# Patient Record
Sex: Female | Born: 1950
Health system: Southern US, Community
[De-identification: ages and names within clinical notes are randomized; demographics above are authoritative.]

## PROBLEM LIST (undated history)

## (undated) DIAGNOSIS — H269 Unspecified cataract: Secondary | ICD-10-CM

## (undated) DIAGNOSIS — M1990 Unspecified osteoarthritis, unspecified site: Secondary | ICD-10-CM

## (undated) DIAGNOSIS — Z8616 Personal history of COVID-19: Secondary | ICD-10-CM

## (undated) DIAGNOSIS — K219 Gastro-esophageal reflux disease without esophagitis: Secondary | ICD-10-CM

## (undated) DIAGNOSIS — F419 Anxiety disorder, unspecified: Secondary | ICD-10-CM

## (undated) DIAGNOSIS — F32A Depression, unspecified: Secondary | ICD-10-CM

## (undated) DIAGNOSIS — R7303 Prediabetes: Secondary | ICD-10-CM

## (undated) DIAGNOSIS — F411 Generalized anxiety disorder: Secondary | ICD-10-CM

## (undated) DIAGNOSIS — F329 Major depressive disorder, single episode, unspecified: Secondary | ICD-10-CM

## (undated) HISTORY — DX: Gastro-esophageal reflux disease without esophagitis: K21.9

## (undated) HISTORY — DX: Unspecified osteoarthritis, unspecified site: M19.90

## (undated) HISTORY — DX: Generalized anxiety disorder: F41.1

## (undated) HISTORY — DX: Unspecified cataract: H26.9

## (undated) HISTORY — DX: Depression, unspecified: F32.A

## (undated) HISTORY — DX: Prediabetes: R73.03

## (undated) HISTORY — DX: Personal history of COVID-19: Z86.16

---
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
## 1999-04-21 ENCOUNTER — Emergency Department (HOSPITAL_COMMUNITY): Admission: EM | Admit: 1999-04-21 | Discharge: 1999-04-21 | Payer: Self-pay | Admitting: Emergency Medicine

## 2000-08-10 ENCOUNTER — Other Ambulatory Visit: Admission: RE | Admit: 2000-08-10 | Discharge: 2000-08-10 | Payer: Self-pay | Admitting: Gynecology

## 2001-03-05 ENCOUNTER — Encounter (INDEPENDENT_AMBULATORY_CARE_PROVIDER_SITE_OTHER): Payer: Self-pay | Admitting: *Deleted

## 2001-03-05 ENCOUNTER — Other Ambulatory Visit: Admission: RE | Admit: 2001-03-05 | Discharge: 2001-03-05 | Payer: Self-pay | Admitting: Gynecology

## 2001-03-21 ENCOUNTER — Emergency Department (HOSPITAL_COMMUNITY): Admission: EM | Admit: 2001-03-21 | Discharge: 2001-03-21 | Payer: Self-pay | Admitting: Emergency Medicine

## 2001-11-01 ENCOUNTER — Other Ambulatory Visit: Admission: RE | Admit: 2001-11-01 | Discharge: 2001-11-01 | Payer: Self-pay | Admitting: Gynecology

## 2001-11-09 ENCOUNTER — Encounter: Admission: RE | Admit: 2001-11-09 | Discharge: 2001-11-09 | Payer: Self-pay | Admitting: Gynecology

## 2001-11-09 ENCOUNTER — Encounter: Payer: Self-pay | Admitting: Gynecology

## 2003-02-06 ENCOUNTER — Other Ambulatory Visit: Admission: RE | Admit: 2003-02-06 | Discharge: 2003-02-06 | Payer: Self-pay | Admitting: Gynecology

## 2003-03-03 ENCOUNTER — Encounter: Admission: RE | Admit: 2003-03-03 | Discharge: 2003-03-03 | Payer: Self-pay | Admitting: *Deleted

## 2003-09-04 ENCOUNTER — Ambulatory Visit (HOSPITAL_COMMUNITY): Admission: RE | Admit: 2003-09-04 | Discharge: 2003-09-04 | Payer: Self-pay | Admitting: Gastroenterology

## 2004-07-20 ENCOUNTER — Emergency Department (HOSPITAL_COMMUNITY): Admission: EM | Admit: 2004-07-20 | Discharge: 2004-07-20 | Payer: Self-pay | Admitting: Emergency Medicine

## 2004-09-04 ENCOUNTER — Other Ambulatory Visit: Admission: RE | Admit: 2004-09-04 | Discharge: 2004-09-04 | Payer: Self-pay | Admitting: Gynecology

## 2005-08-01 ENCOUNTER — Encounter (INDEPENDENT_AMBULATORY_CARE_PROVIDER_SITE_OTHER): Payer: Self-pay | Admitting: *Deleted

## 2005-08-01 ENCOUNTER — Ambulatory Visit (HOSPITAL_COMMUNITY): Admission: RE | Admit: 2005-08-01 | Discharge: 2005-08-01 | Payer: Self-pay | Admitting: Gynecology

## 2005-08-01 ENCOUNTER — Ambulatory Visit (HOSPITAL_BASED_OUTPATIENT_CLINIC_OR_DEPARTMENT_OTHER): Admission: RE | Admit: 2005-08-01 | Discharge: 2005-08-01 | Payer: Self-pay | Admitting: Gynecology

## 2005-11-04 ENCOUNTER — Encounter: Admission: RE | Admit: 2005-11-04 | Discharge: 2005-11-04 | Payer: Self-pay | Admitting: Family Medicine

## 2006-02-10 ENCOUNTER — Other Ambulatory Visit: Admission: RE | Admit: 2006-02-10 | Discharge: 2006-02-10 | Payer: Self-pay | Admitting: Gynecology

## 2006-02-10 ENCOUNTER — Encounter: Admission: RE | Admit: 2006-02-10 | Discharge: 2006-02-10 | Payer: Self-pay | Admitting: Gynecology

## 2006-09-02 ENCOUNTER — Ambulatory Visit: Payer: Self-pay | Admitting: Family Medicine

## 2007-02-24 ENCOUNTER — Encounter: Admission: RE | Admit: 2007-02-24 | Discharge: 2007-02-24 | Payer: Self-pay | Admitting: Gynecology

## 2007-03-04 ENCOUNTER — Encounter: Admission: RE | Admit: 2007-03-04 | Discharge: 2007-03-04 | Payer: Self-pay | Admitting: Gynecology

## 2007-03-15 ENCOUNTER — Other Ambulatory Visit: Admission: RE | Admit: 2007-03-15 | Discharge: 2007-03-15 | Payer: Self-pay | Admitting: Gynecology

## 2007-12-21 ENCOUNTER — Encounter: Admission: RE | Admit: 2007-12-21 | Discharge: 2007-12-21 | Payer: Self-pay | Admitting: Family Medicine

## 2008-01-14 ENCOUNTER — Emergency Department (HOSPITAL_COMMUNITY): Admission: EM | Admit: 2008-01-14 | Discharge: 2008-01-14 | Payer: Self-pay | Admitting: Emergency Medicine

## 2008-03-27 ENCOUNTER — Encounter: Admission: RE | Admit: 2008-03-27 | Discharge: 2008-03-27 | Payer: Self-pay | Admitting: Gynecology

## 2009-07-16 ENCOUNTER — Encounter: Admission: RE | Admit: 2009-07-16 | Discharge: 2009-07-16 | Payer: Self-pay | Admitting: Gynecology

## 2010-12-09 ENCOUNTER — Emergency Department (HOSPITAL_COMMUNITY)
Admission: EM | Admit: 2010-12-09 | Discharge: 2010-12-09 | Payer: Self-pay | Source: Home / Self Care | Admitting: Emergency Medicine

## 2011-05-02 NOTE — Op Note (Signed)
[name], [name] NO.:  [id number]   MEDICAL RECORD NO.:  [id number]          PATIENT TYPE:  AMB   LOCATION:  [facility]                         FACILITY:  [facility]   PHYSICIAN:  Gretta Cool, M.D. DATE OF BIRTH:  03-01-1951   DATE OF PROCEDURE:  08/01/2005  DATE OF DISCHARGE:                                 OPERATIVE REPORT   SURGEON:  Gretta Cool, M.D.   ANESTHESIA:  MAC plus paracervical block.   PROCEDURES:  Hysteroscopy, resection of endometrial polyps and fibroids,  total endometrial resection for ablation.   DESCRIPTION OF PROCEDURE:  Under excellent anesthesia as above with the  patient prepped and draped in Allen stirrups lithotomy position, the cervix  was grasped with a single tooth tenaculum and pulled down into view. It was  then progressively dilated with a series of Pratt dilators to 33. The 7 mm  resectoscope was then introduced and the cavity photographed. There was a  large anterior wall structure right in the lower uterine segment that was  determined to be a fibroid estimated to be 2 x 3 cm. There was also a  posterior wall cystic structure that I believe was adenomyoma. Both  fallopian tubes were enormous in caliber. The initial portion of the  procedure was dedicated to resection of the cornual area and then cautery of  the tubal ostium to prevent excessive fluid loss. The polyps and fibroids  were then removed by resection. Once the fibroids and polyps were resected,  the entire endometrial cavity was resected 5 mm into the myometrial tissue.  Once the entire cavity was evacuated, the VaporTrode was applied and the  entire cavity treated once again with VaporTrode at 200 watts pure cut. With  pressure reduced, bleeding points were cauterized. At the end of the  procedure, there was no significant bleeding. The patient was returned to  the recovery room in excellent condition without complications. Fluid  deficit 200 mL. Complications  none.           ______________________________  Gretta Cool, M.D.     CWL/MEDQ  D:  08/01/2005  T:  08/01/2005  Job:  [id number]   cc:   Talmadge Coventry, M.D.  [address]  [place]  [place] [address]  Fax: [phone]

## 2011-05-02 NOTE — Op Note (Signed)
NAME:  Tammy [name], Tammy [name]                           ACCOUNT NO.:  [id number]   MEDICAL RECORD NO.:  [id number]                   PATIENT TYPE:  AMB   LOCATION:  ENDO                                 FACILITY:  [facility]   PHYSICIAN:  Anselmo Rod, M.D.               DATE OF BIRTH:  25-Mar-1951   DATE OF PROCEDURE:  09/06/2003  DATE OF DISCHARGE:  09/04/2003                                 OPERATIVE REPORT   PROCEDURE PERFORMED:  Screening colonoscopy.   ENDOSCOPIST:  Anselmo Rod, M.D.   INSTRUMENT USED:  Olympus video colonoscope.   INDICATION FOR PROCEDURE:  A 60 year old female undergoing screening  colonoscopy.  The patient has a history of borderline iron-deficiency  anemia.  Rule out colonic polyps, masses, etc.   PREPROCEDURE PREPARATION:  Informed consent was procured from the patient.  The patient was fasted for eight hours prior to the procedure and prepped  with a bottle of magnesium citrate and a gallon of GoLYTELY the night prior  to the procedure.   PREPROCEDURE PHYSICAL:  VITAL SIGNS:  The patient had stable vital signs.  NECK:  Supple.  CHEST:  Clear to auscultation.  S1, S2 regular.  ABDOMEN:  Soft with normal bowel sounds.   DESCRIPTION OF PROCEDURE:  The patient was placed in the left lateral  decubitus position and sedated with Demerol and Versed.  Once the patient  was adequately sedate and maintained on low-flow oxygen and continuous  cardiac monitoring, the Olympus video colonoscope was advanced from the  rectum to the cecum with difficulty.  There was some residual stool in the  colon.  Multiple washes were done.  No masses or polyps were seen.  There  was a prominent anal papilla seen on retroflexion in the rectum.  The  appendiceal orifice and the ileocecal valve were clearly visualized and  photographed.  The terminal ileum appeared normal.   IMPRESSION:  1. Essentially normal colonoscopy up to the terminal ileum except for a     prominent anal  papilla seen on retroflexion.  2. No masses or polyps identified.  3. No evidence of diverticulosis.   RECOMMENDATIONS:  1. A high-fiber diet with liberal fluid intake has been advocated.  2.     Repeat CRC screening is recommended in the next 10 years unless the patient      develops any abnormal symptoms in the interim.  3. Outpatient follow-up on a p.r.n. basis.                                               Anselmo Rod, M.D.    JNM/MEDQ  D:  09/06/2003  T:  09/07/2003  Job:  [id number]   cc:   Christella Noa, M.D.  [address] [place]  [address]  [place], [place] [address]  Fax: [phone]

## 2011-05-02 NOTE — Op Note (Signed)
   NAME:  Tammy [name], Tammy [name]                           ACCOUNT NO.:  [id number]   MEDICAL RECORD NO.:  [id number]                   PATIENT TYPE:  AMB   LOCATION:  ENDO                                 FACILITY:  [facility]   PHYSICIAN:  Anselmo Rod, M.D.               DATE OF BIRTH:  04-10-1951   DATE OF PROCEDURE:  09/04/2003  DATE OF DISCHARGE:                                 OPERATIVE REPORT   No dictation for this job.                                               Anselmo Rod, M.D.    JNM/MEDQ  D:  09/04/2003  T:  09/05/2003  Job:  [id number]

## 2011-05-02 NOTE — Consult Note (Signed)
NAME:  Tammy [name], JUNKINS NO.:  [id number]   MEDICAL RECORD NO.:  [id number]                   PATIENT TYPE:  EMS   LOCATION:  ED                                   FACILITY:  [facility]   PHYSICIAN:  Tilda Burrow, M.D.              DATE OF BIRTH:  30-May-1951   DATE OF CONSULTATION:  07/20/2004  DATE OF DISCHARGE:                                   CONSULTATION   CHIEF COMPLAINT:  Heavy vaginal bleeding, post-menopausal.   HISTORY OF PRESENT ILLNESS:  This 60 year old female who has been on  initially combined estrogen/progesterone hormone therapy, and more recently  has been on unopposed estrogen which she reports is Vivelle Dot 0.5 mg  patches twice weekly, presents with two days of vaginal bleeding which has  increased in the past 12 hours.  She denies discontinuing her patches.  She  presents with suprapubic cramping.  GYN history is otherwise negative with  annual care by Dr. Beather Arbour in [place].  This was the first episode  of post-menopausal bleeding.  She complains of low back pain and standard  cramps similar to prior menses.  At the time of presentation she is changing  pads very heavily.  Her last normal menses was December 2004, when she went  to unopposed therapy.   REVIEW OF SYSTEMS:  Generally negative except for weakness secondary to  cramping.  She has no syncope, lightheadedness.  She is able to ambulate.   PAST MEDICAL HISTORY:  History of anemia.   PRIMARY CARE PHYSICIAN:  1. Talmadge Coventry, M.D.  2. Gretta Cool, M.D., for gynecology.   PAST SURGICAL HISTORY:  Cesarean section x2.   ALLERGIES:  No known drug allergies.   MEDICATIONS:  1. Antibiotics for sinusitis.  2. Lexapro.  3. Vivelle Dot hormone patch.   SOCIAL HISTORY:  Noncontributory.   PHYSICAL EXAMINATION:  VITAL SIGNS:  Temperature 97.1, pulse ox 100%, blood  pressure 170/78, respirations 18, heart rate 88, with no orthostasis.  Hemoglobin 13,  hematocrit 37.  Urinalysis contaminated by red blood.  ABDOMEN:  Suprapubic tenderness only without any masses.  The initial nurse  practitioner examination suggested a mass effect, but on repeat examination  by me it appears this is simply the uterus beneath a long-standing thickened  cesarean section scar.  PELVIC:  Speculum examination shows generous blood and clots in the vaginal  vault.  No tissue.  The cervix is essentially closed.  The uterus is  anteflexed, within normal limits of size.  There is no enlargement of the  uterus, but primarily her perception of enlargement are attributed to the  cesarean section scar.  The adnexa are negative for masses.   LABORATORY DATA:  Prior CT scan had been done to rule out for the  possibility of a mass, and that is essentially negative.   Options discussed with the patient.  Endometrial biopsy  recommended as both  diagnostic and potentially therapeutic.  The patient wishes to request  medical therapy in avoidance of biopsy at this time.  We will therefore  attempt to treat with Megace 40 mg b.i.d. x7 days, limited activity,  continue the use of tampons, monitoring pads with the patient to notify me  if bleeding gets greater than one pad per hour in the future.  Would contact  Dr. Nicholas Lose Monday morning.      ___________________________________________                                            Tilda Burrow, M.D.   JVF/MEDQ  D:  07/20/2004  T:  07/20/2004  Job:  [id number]   cc:   Gretta Cool, M.D.  [address] [place]  [place] [address]  Fax: [phone]

## 2011-05-27 ENCOUNTER — Other Ambulatory Visit: Payer: Self-pay | Admitting: Gynecology

## 2011-05-27 DIAGNOSIS — Z1231 Encounter for screening mammogram for malignant neoplasm of breast: Secondary | ICD-10-CM

## 2011-05-28 ENCOUNTER — Ambulatory Visit
Admission: RE | Admit: 2011-05-28 | Discharge: 2011-05-28 | Disposition: A | Discharge status: Home / Self Care | Payer: PRIVATE HEALTH INSURANCE | Source: Ambulatory Visit | Attending: Gynecology | Admitting: Gynecology

## 2011-05-28 DIAGNOSIS — Z1231 Encounter for screening mammogram for malignant neoplasm of breast: Secondary | ICD-10-CM

## 2011-06-03 ENCOUNTER — Other Ambulatory Visit: Payer: Self-pay | Admitting: Gynecology

## 2011-09-04 LAB — DIFFERENTIAL
Basophils Absolute: 0
Basophils Relative: 0
Eosinophils Absolute: 0
Eosinophils Relative: 0
Lymphocytes Relative: 14
Lymphs Abs: 1.2
Monocytes Absolute: 0.8
Monocytes Relative: 9
Neutro Abs: 6.5
Neutrophils Relative %: 77

## 2011-09-04 LAB — CBC
HCT: 41.6
Hemoglobin: 14.4
MCHC: 34.6
MCV: 96.6
Platelets: 317
RBC: 4.31
RDW: 12.6
WBC: 8.4

## 2011-09-04 LAB — STREP A DNA PROBE: Group A Strep Probe: NEGATIVE

## 2011-09-04 LAB — RAPID STREP SCREEN (MED CTR MEBANE ONLY): Streptococcus, Group A Screen (Direct): NEGATIVE

## 2011-09-04 LAB — BASIC METABOLIC PANEL
BUN: 15
CO2: 27
Calcium: 9.4
Chloride: 105
Creatinine, Ser: 0.83
GFR calc Af Amer: >60
GFR calc non Af Amer: >60
Glucose, Bld: 127 — ABNORMAL HIGH
Potassium: 3.6
Sodium: 140

## 2012-10-04 ENCOUNTER — Ambulatory Visit
Admission: RE | Admit: 2012-10-04 | Discharge: 2012-10-04 | Disposition: A | Discharge status: Home / Self Care | Payer: BC Managed Care – PPO | Source: Ambulatory Visit | Attending: Family Medicine | Admitting: Family Medicine

## 2012-10-04 ENCOUNTER — Other Ambulatory Visit: Payer: Self-pay | Admitting: Family Medicine

## 2012-10-04 DIAGNOSIS — M5412 Radiculopathy, cervical region: Secondary | ICD-10-CM

## 2012-10-05 ENCOUNTER — Other Ambulatory Visit: Payer: Self-pay | Admitting: Gynecology

## 2012-10-05 DIAGNOSIS — Z1231 Encounter for screening mammogram for malignant neoplasm of breast: Secondary | ICD-10-CM

## 2012-10-06 ENCOUNTER — Ambulatory Visit
Admission: RE | Admit: 2012-10-06 | Discharge: 2012-10-06 | Disposition: A | Discharge status: Home / Self Care | Payer: BC Managed Care – PPO | Source: Ambulatory Visit | Attending: Gynecology | Admitting: Gynecology

## 2012-10-06 DIAGNOSIS — Z1231 Encounter for screening mammogram for malignant neoplasm of breast: Secondary | ICD-10-CM

## 2012-10-18 ENCOUNTER — Other Ambulatory Visit: Payer: Self-pay | Admitting: Gynecology

## 2013-09-30 LAB — HM COLONOSCOPY

## 2013-10-17 ENCOUNTER — Other Ambulatory Visit: Payer: Self-pay

## 2013-10-17 DIAGNOSIS — Z1231 Encounter for screening mammogram for malignant neoplasm of breast: Secondary | ICD-10-CM

## 2013-11-15 ENCOUNTER — Ambulatory Visit
Admission: RE | Admit: 2013-11-15 | Discharge: 2013-11-15 | Disposition: A | Discharge status: Home / Self Care | Payer: PRIVATE HEALTH INSURANCE | Source: Ambulatory Visit

## 2013-11-15 DIAGNOSIS — Z1231 Encounter for screening mammogram for malignant neoplasm of breast: Secondary | ICD-10-CM

## 2014-07-04 ENCOUNTER — Other Ambulatory Visit: Payer: Self-pay | Admitting: Gastroenterology

## 2014-07-04 DIAGNOSIS — R110 Nausea: Secondary | ICD-10-CM

## 2014-07-20 ENCOUNTER — Encounter (HOSPITAL_COMMUNITY)
Admission: RE | Admit: 2014-07-20 | Discharge: 2014-07-20 | Disposition: A | Discharge status: Home / Self Care | Payer: 59 | Source: Ambulatory Visit | Attending: Gastroenterology | Admitting: Gastroenterology

## 2014-07-20 ENCOUNTER — Ambulatory Visit (HOSPITAL_COMMUNITY)
Admission: RE | Admit: 2014-07-20 | Discharge: 2014-07-20 | Disposition: A | Discharge status: Home / Self Care | Payer: 59 | Source: Ambulatory Visit | Attending: Diagnostic Radiology | Admitting: Diagnostic Radiology

## 2014-07-20 DIAGNOSIS — K7689 Other specified diseases of liver: Secondary | ICD-10-CM | POA: Insufficient documentation

## 2014-07-20 DIAGNOSIS — R110 Nausea: Secondary | ICD-10-CM

## 2014-07-20 MED ORDER — SINCALIDE 5 MCG IJ SOLR: 0.0200 ug/kg | Freq: Once | INTRAMUSCULAR | Status: DC

## 2014-07-20 MED ORDER — TECHNETIUM TC 99M MEBROFENIN IV KIT
5.5000 | PACK | Freq: Once | INTRAVENOUS | Status: AC | PRN
  Administered 2014-07-20: 5.5 via INTRAVENOUS

## 2014-11-28 ENCOUNTER — Other Ambulatory Visit: Payer: Self-pay

## 2014-11-28 DIAGNOSIS — Z1231 Encounter for screening mammogram for malignant neoplasm of breast: Secondary | ICD-10-CM

## 2014-12-13 ENCOUNTER — Ambulatory Visit: Payer: PRIVATE HEALTH INSURANCE

## 2014-12-29 ENCOUNTER — Other Ambulatory Visit: Payer: Self-pay | Admitting: Family Medicine

## 2014-12-29 ENCOUNTER — Other Ambulatory Visit (HOSPITAL_COMMUNITY)
Admission: RE | Admit: 2014-12-29 | Discharge: 2014-12-29 | Disposition: A | Discharge status: Home / Self Care | Payer: 59 | Source: Ambulatory Visit | Attending: Family Medicine | Admitting: Family Medicine

## 2014-12-29 DIAGNOSIS — E2839 Other primary ovarian failure: Secondary | ICD-10-CM

## 2014-12-29 DIAGNOSIS — Z01419 Encounter for gynecological examination (general) (routine) without abnormal findings: Secondary | ICD-10-CM | POA: Insufficient documentation

## 2015-01-01 ENCOUNTER — Ambulatory Visit
Admission: RE | Admit: 2015-01-01 | Discharge: 2015-01-01 | Disposition: A | Discharge status: Home / Self Care | Payer: 59 | Source: Ambulatory Visit

## 2015-01-01 DIAGNOSIS — Z1231 Encounter for screening mammogram for malignant neoplasm of breast: Secondary | ICD-10-CM

## 2015-01-02 LAB — CYTOLOGY - PAP

## 2015-01-03 ENCOUNTER — Ambulatory Visit
Admission: RE | Admit: 2015-01-03 | Discharge: 2015-01-03 | Disposition: A | Discharge status: Home / Self Care | Payer: 59 | Source: Ambulatory Visit | Attending: Family Medicine | Admitting: Family Medicine

## 2015-01-03 ENCOUNTER — Other Ambulatory Visit: Payer: PRIVATE HEALTH INSURANCE

## 2015-01-03 DIAGNOSIS — E2839 Other primary ovarian failure: Secondary | ICD-10-CM

## 2016-02-21 ENCOUNTER — Other Ambulatory Visit: Payer: Self-pay

## 2016-02-21 DIAGNOSIS — Z1231 Encounter for screening mammogram for malignant neoplasm of breast: Secondary | ICD-10-CM

## 2016-03-06 ENCOUNTER — Ambulatory Visit
Admission: RE | Admit: 2016-03-06 | Discharge: 2016-03-06 | Disposition: A | Discharge status: Home / Self Care | Payer: 59 | Source: Ambulatory Visit

## 2016-03-06 DIAGNOSIS — Z1231 Encounter for screening mammogram for malignant neoplasm of breast: Secondary | ICD-10-CM

## 2017-05-06 IMAGING — MG MM SCREEN MAMMOGRAM BILATERAL
5 series · 5 of 5 positions shown · non-contrast
Comparison: Previous exam(s).

CLINICAL DATA: Screening.

EXAM:
DIGITAL SCREENING BILATERAL MAMMOGRAM WITH CAD

[R CC]
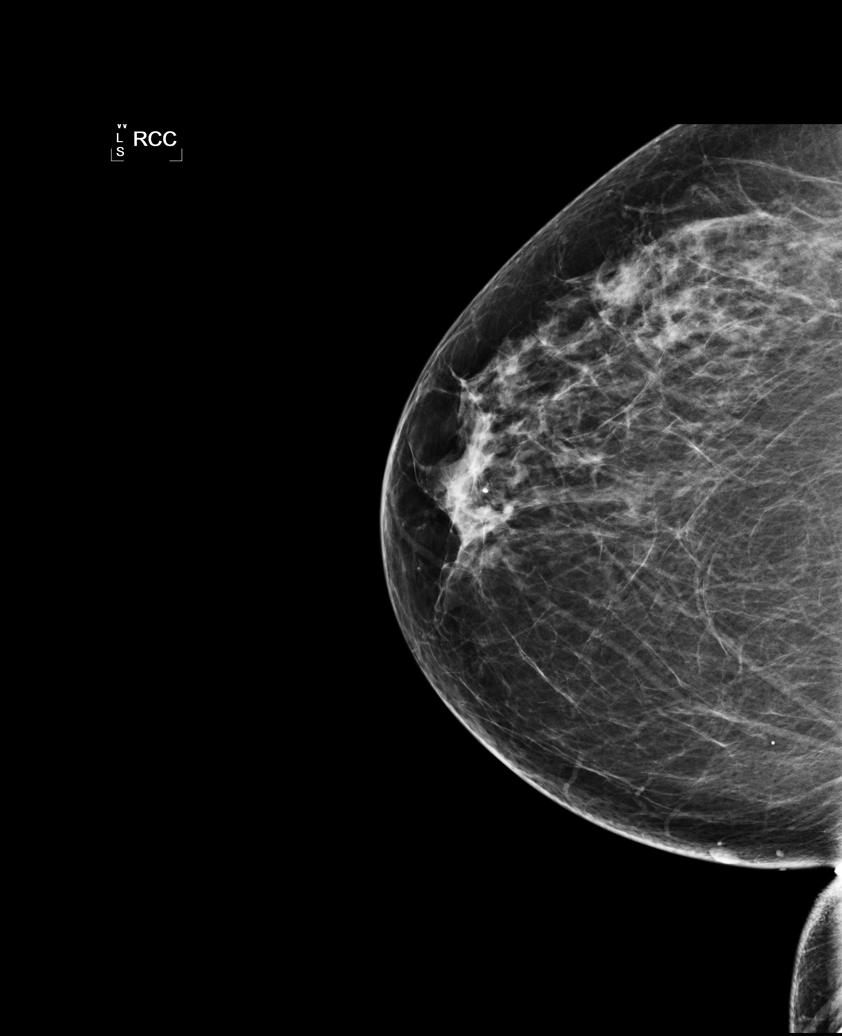

[L CC]
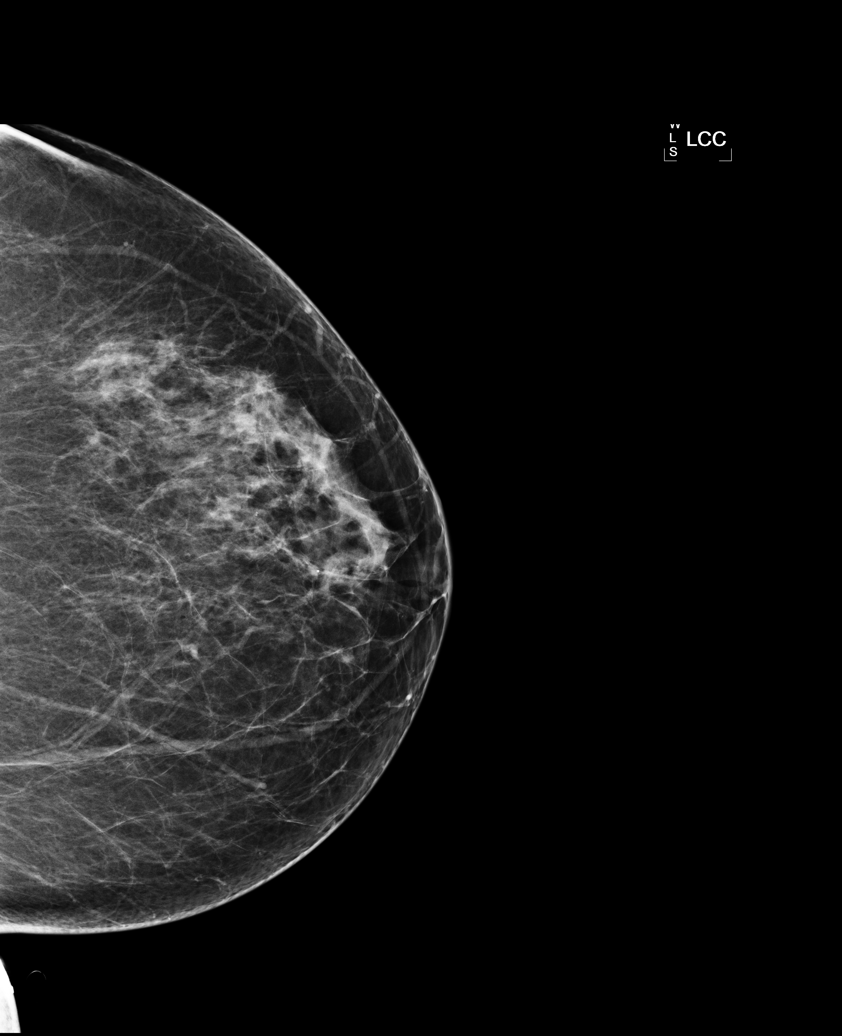

[L MLO]
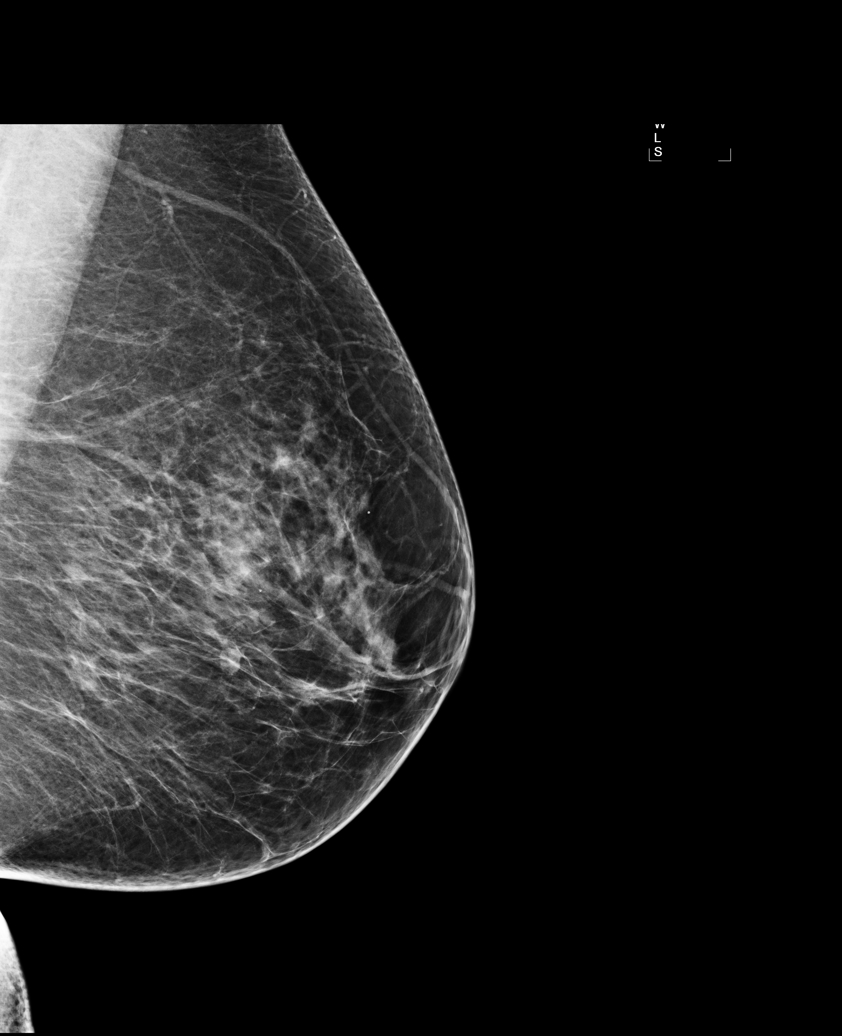

[R MLO (1 of 2)]
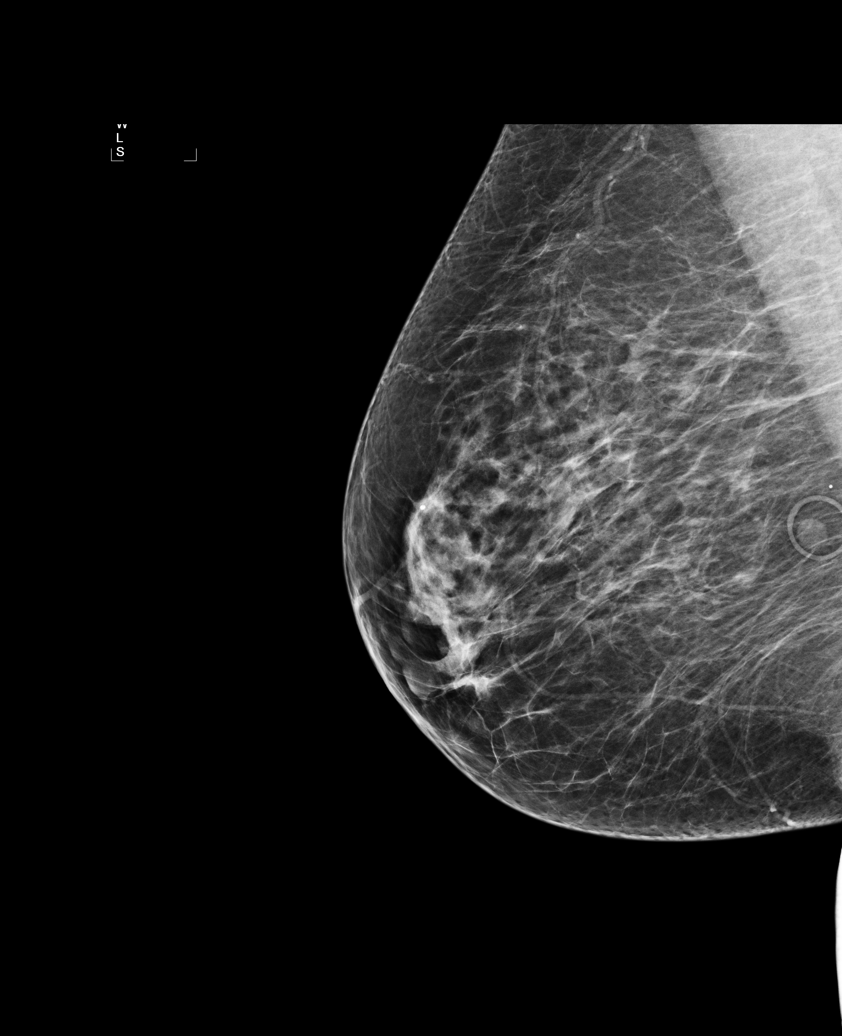

[R MLO (2 of 2)]
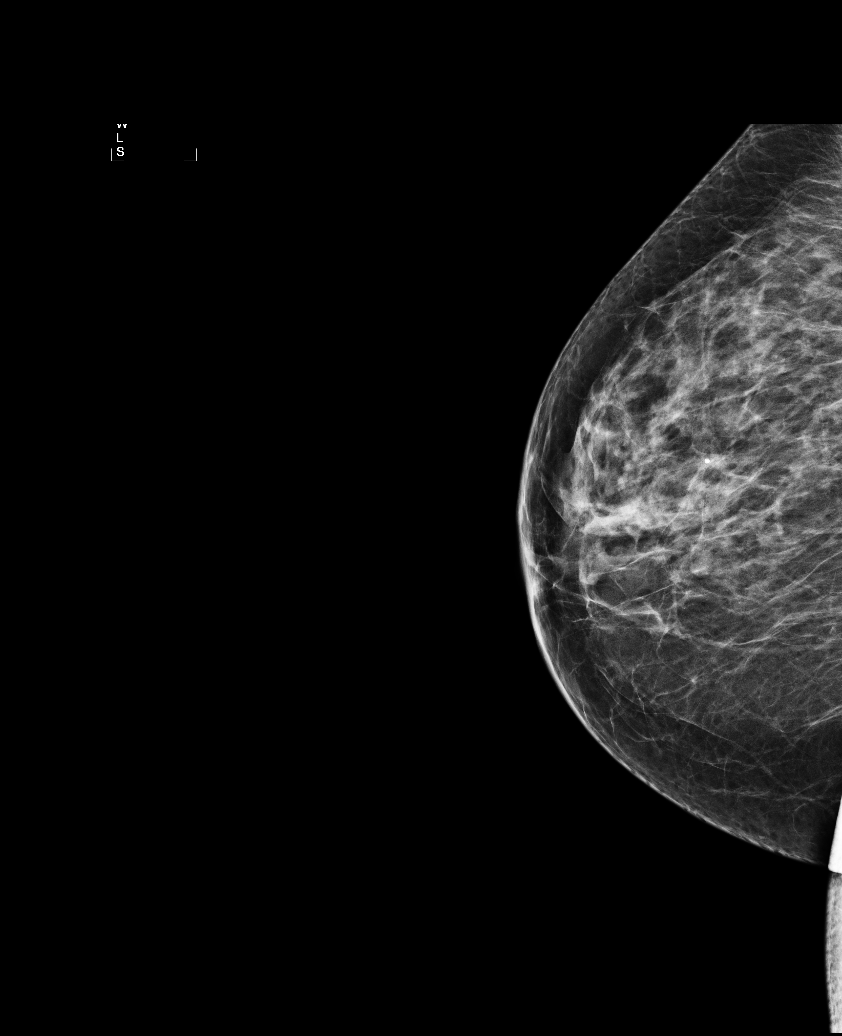

[5 of 5 positions shown; findings below may reference images not displayed]

ACR Breast Density Category b: There are scattered areas of
fibroglandular density.
FINDINGS: There are no findings suspicious for malignancy. Images were
processed with CAD.
IMPRESSION: No mammographic evidence of malignancy. A result letter of this
screening mammogram will be mailed directly to the patient.

RECOMMENDATION:
Screening mammogram in one year. (Code:AS-G-LCT)

BI-RADS CATEGORY  1: Negative.

## 2018-02-05 ENCOUNTER — Ambulatory Visit (INDEPENDENT_AMBULATORY_CARE_PROVIDER_SITE_OTHER): Payer: 59 | Admitting: Family Medicine

## 2018-02-05 ENCOUNTER — Encounter: Payer: Self-pay | Admitting: Family Medicine

## 2018-02-05 VITALS — BP 120/70 | HR 80 | Ht 63.5 in | Wt 152.4 lb

## 2018-02-05 DIAGNOSIS — F411 Generalized anxiety disorder: Secondary | ICD-10-CM

## 2018-02-05 DIAGNOSIS — Z7689 Persons encountering health services in other specified circumstances: Secondary | ICD-10-CM | POA: Diagnosis not present

## 2018-02-05 DIAGNOSIS — R7303 Prediabetes: Secondary | ICD-10-CM

## 2018-02-05 MED ORDER — ALPRAZOLAM 1 MG PO TABS: ORAL_TABLET | ORAL | 0 refills | Status: DC

## 2018-02-05 NOTE — Progress Notes (Signed)
   Subjective:    Patient ID: Tammy [name], female    DOB: 10/30/1951, 67 y.o.   MRN: [id number]  HPI Chief Complaint  Patient presents with  . new pt    new pt get established   She is new to the practice and here to establish care.  [facility] Dr. Cain Saupeammie Fulp  Prediabetes- went to see the nutritionist and has changed her diet.   History of anxiety. Denies depression.  Taking Lexapro daily for the past 10 years. She is stable on this.   States she is having increased anxiety due to family issues and requests refill on alprazolam.  States she takes 1/2 tab Xanax occasionally for anxiety. She has an empty bottle of Xanax 1 mg tabs with her from Dr. Jillyn HiddenFulp prescribed in 10/2017 #145 tablets.  Denies ever being in counseling.   No records available today.     States her husband passed in 2012, her twin brother died in a car accident in 2014.   Health maintenance:  Mammogram - up to date  Colonoscopy- due again at age 67.   She lives with her son. 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   Denies fever, chills, dizziness, chest pain, palpitations, shortness of breath, abdominal pain, N/V/D, urinary symptoms, LE edema.   Reviewed allergies, medications, past medical, surgical, family, and social history.    Review of Systems Pertinent positives and negatives in the history of present illness.     Objective:   Physical Exam BP 120/70   Pulse 80   Ht 5' 3.5" (1.613 m)   Wt 152 lb 6.4 oz (69.1 kg)   BMI 26.57 kg/m  Alert and in no distress.  Cardiac exam shows a regular sinus rhythm without murmurs or gallops. Lungs are clear to auscultation.      Assessment & Plan:  GAD (generalized anxiety disorder) - Plan: ALPRAZolam (XANAX) 1 MG tablet  Prediabetes  Encounter to establish care  In depth counseling on managing anxiety and not letting her anxiety control her.  Discussed counseling as an option. She declines for now. Does not appear to be depressed.    Xanax 30 day supply prescribed. Discussed this is short term treatment and not intended for long term use.  Recommend she find other ways to help manage her anxiety such as the Lords prayer, talking to someone, walking, deep breathing. She verbalized understanding.   Follow up for prediabetes and anxiety in 3 months.  Will attempt to get medical records from [facility].

## 2018-02-16 ENCOUNTER — Encounter: Payer: Self-pay | Admitting: Family Medicine

## 2018-02-16 ENCOUNTER — Ambulatory Visit (INDEPENDENT_AMBULATORY_CARE_PROVIDER_SITE_OTHER): Payer: 59 | Admitting: Family Medicine

## 2018-02-16 VITALS — BP 112/72 | HR 86 | Temp 97.6°F | Wt 154.6 lb

## 2018-02-16 DIAGNOSIS — R350 Frequency of micturition: Secondary | ICD-10-CM | POA: Diagnosis not present

## 2018-02-16 DIAGNOSIS — N3000 Acute cystitis without hematuria: Secondary | ICD-10-CM | POA: Diagnosis not present

## 2018-02-16 DIAGNOSIS — K219 Gastro-esophageal reflux disease without esophagitis: Secondary | ICD-10-CM

## 2018-02-16 LAB — POCT URINALYSIS DIP (PROADVANTAGE DEVICE)
Bilirubin, UA: NEGATIVE
Glucose, UA: NEGATIVE mg/dL
Ketones, POC UA: NEGATIVE mg/dL
Nitrite, UA: NEGATIVE
Protein Ur, POC: NEGATIVE mg/dL
Specific Gravity, Urine: 1.03
Urobilinogen, Ur: NEGATIVE
pH, UA: 6 (ref 5.0–8.0)

## 2018-02-16 MED ORDER — ESOMEPRAZOLE MAGNESIUM 20 MG PO CPDR: 20.0000 mg | DELAYED_RELEASE_CAPSULE | Freq: Every day | ORAL | 1 refills | Status: DC

## 2018-02-16 MED ORDER — SULFAMETHOXAZOLE-TRIMETHOPRIM 800-160 MG PO TABS: 1.0000 | ORAL_TABLET | Freq: Two times a day (BID) | ORAL | 0 refills | Status: DC

## 2018-02-16 NOTE — Patient Instructions (Signed)
Take the antibiotic as prescribed.  Increase your water intake and you may take over-the-counter AZO for pain.  Do not take AZO more than 2 days so that we can make sure the antibiotic is working.  If your symptoms are not completely resolved after finishing antibiotic let me know.

## 2018-02-16 NOTE — Progress Notes (Signed)
Subjective:  Tammy [name] is a 67 y.o. female who complains of possible urinary tract infection.  She has had symptoms for 4 days.  Symptoms include dysuria, suprapubic pressure, urinary frequency, urgency . Patient denies fever, chills, back pain, nausea, vomiting or diarrhea. .  Last UTI was years ago.   Using nothing for current symptoms.    Patient does not have a history of recurrent UTI. Patient does not have a history of pyelonephritis.  No other aggravating or relieving factors.    Reports history of GERD. States she takes Nexium occasionally but it out of this. Requests a prescription for this.   Past Medical History:  Diagnosis Date  . GAD (generalized anxiety disorder)   . Prediabetes     ROS as in subjective  Reviewed allergies, medications, past medical, surgical, and social history.    Objective: Vitals:   02/16/18 1501  BP: 112/72  Pulse: 86  Temp: 97.6 F (36.4 C)  SpO2: 98%    General appearance: alert, no distress, WD/WN, female Abdomen: +bs, soft, non tender, non distended, no masses, no hepatomegaly, no splenomegaly, no bruits Back: no CVA tenderness GU: declines      Laboratory:  Urine dipstick: 3+ for leukocyte esterase.      Assessment: Acute cystitis without hematuria - Plan: sulfamethoxazole-trimethoprim (BACTRIM DS,SEPTRA DS) 800-160 MG tablet  Urine frequency - Plan: POCT Urinalysis DIP (Proadvantage Device)  Gastroesophageal reflux disease, esophagitis presence not specified - Plan: esomeprazole (NEXIUM) 20 MG capsule    Plan: Discussed symptoms, diagnosis, possible complications, and usual course of illness.  Prescribed Bactrim.   Advised increased water intake, can use OTC Tylenol for pain.    Advised she may take AZO for 1-2 days and then stop.     Urine culture was not sent.   Call or return if worse or not improving.    Nexium prescribed per patient request for occasional GERD.

## 2018-03-10 ENCOUNTER — Other Ambulatory Visit: Payer: Self-pay | Admitting: Family Medicine

## 2018-03-10 DIAGNOSIS — K219 Gastro-esophageal reflux disease without esophagitis: Secondary | ICD-10-CM

## 2018-03-24 ENCOUNTER — Telehealth: Payer: Self-pay

## 2018-03-24 DIAGNOSIS — K219 Gastro-esophageal reflux disease without esophagitis: Secondary | ICD-10-CM

## 2018-03-24 MED ORDER — ESOMEPRAZOLE MAGNESIUM 20 MG PO CPDR: DELAYED_RELEASE_CAPSULE | ORAL | 0 refills | Status: DC

## 2018-03-24 NOTE — Telephone Encounter (Signed)
[facility] sent a fax requesting a 90 day supply on Esomeprazole. This is being changes per request. 90 day supply with no refills.

## 2018-05-05 ENCOUNTER — Encounter: Payer: Self-pay | Admitting: Family Medicine

## 2018-05-05 ENCOUNTER — Ambulatory Visit (INDEPENDENT_AMBULATORY_CARE_PROVIDER_SITE_OTHER): Payer: 59 | Admitting: Family Medicine

## 2018-05-05 VITALS — BP 124/80 | HR 88 | Ht 63.0 in | Wt 159.0 lb

## 2018-05-05 DIAGNOSIS — R7303 Prediabetes: Secondary | ICD-10-CM

## 2018-05-05 DIAGNOSIS — F411 Generalized anxiety disorder: Secondary | ICD-10-CM

## 2018-05-05 DIAGNOSIS — Z1231 Encounter for screening mammogram for malignant neoplasm of breast: Secondary | ICD-10-CM | POA: Diagnosis not present

## 2018-05-05 DIAGNOSIS — K219 Gastro-esophageal reflux disease without esophagitis: Secondary | ICD-10-CM | POA: Diagnosis not present

## 2018-05-05 DIAGNOSIS — E785 Hyperlipidemia, unspecified: Secondary | ICD-10-CM | POA: Diagnosis not present

## 2018-05-05 DIAGNOSIS — Z1239 Encounter for other screening for malignant neoplasm of breast: Secondary | ICD-10-CM

## 2018-05-05 HISTORY — DX: Gastro-esophageal reflux disease without esophagitis: K21.9

## 2018-05-05 MED ORDER — ESCITALOPRAM OXALATE 20 MG PO TABS: 20.0000 mg | ORAL_TABLET | Freq: Every day | ORAL | 1 refills | Status: DC

## 2018-05-05 NOTE — Progress Notes (Signed)
   Subjective:    Patient ID: Tammy [name], female    DOB: Mar 05, 1951, 67 y.o.   MRN: [id number]  HPI Chief Complaint  Patient presents with  . med check    med check. no other concerns   She is here for a nonfasting med check. She also states she is overdue for mammogram and did not get one last year. She would like to go ahead and get this.   Reports doing well on Lexapro and no side effects. States she has not needed Xanax. Only taken 1/2 tablet since her refill.   GERD- taking Nexium daily. No concerns.   Denies fever, chills, dizziness, chest pain, palpitations, shortness of breath, abdominal pain, N/V/D, urinary symptoms, LE edema.    Received labs and office notes from [place] from 01/2017 at her last CPE. She reports now having medicare.   Reviewed allergies, medications, past medical, surgical, family, and social history.   Review of Systems Pertinent positives and negatives in the history of present illness.     Objective:   Physical Exam BP 124/80   Pulse 88   Ht  (1.6 m)   Wt 159 lb (72.1 kg)   BMI 28.17 kg/m   Alert and oriented and in no acute distress. Not otherwise examined.       Assessment & Plan:  Prediabetes - Plan: CBC with Differential/Platelet, Comprehensive metabolic panel, TSH, Hemoglobin A1c  GAD (generalized anxiety disorder) - Plan: escitalopram (LEXAPRO) 20 MG tablet  Screening for breast cancer - Plan: MM DIGITAL SCREENING BILATERAL  Gastroesophageal reflux disease, esophagitis presence not specified  Dyslipidemia  Reviewed labs and notes from [place]. Last CPE in 01/2017.  Prediabetes with a hemoglobin A1c of 5.8% in 01/2017.  Lipids stable in 01/2017 total cholesterol 227, LDL 155, HDL 46  She is not fasting today but will return for welcome to medicare visit and fasting lipids.  She has been stable on 20 mg of Lexapro. Is not needing Xanax.  Advised to cut back on Nexium use and try to take prn. avoid food triggers. Avoid  large meals and eating and laying down.  Follow up pending labs or for IPPE fasting.

## 2018-05-05 NOTE — Patient Instructions (Signed)
Call and schedule your mammogram.   Try to cut back on daily Nexium and take this as needed.   You are due for your welcome to Medicare visit and we can do this at your convenience. Come in fasting so we can check your cholesterol that visit as well.

## 2018-05-06 LAB — COMPREHENSIVE METABOLIC PANEL
ALT: 24 [IU]/L (ref 0–32)
AST: 25 [IU]/L (ref 0–40)
Albumin/Globulin Ratio: 1.6 (ref 1.2–2.2)
Albumin: 4.1 g/dL (ref 3.6–4.8)
Alkaline Phosphatase: 76 [IU]/L (ref 39–117)
BUN/Creatinine Ratio: 21 (ref 12–28)
BUN: 14 mg/dL (ref 8–27)
Bilirubin Total: <0.2 mg/dL (ref 0.0–1.2)
CO2: 27 mmol/L (ref 20–29)
Calcium: 9.6 mg/dL (ref 8.7–10.3)
Chloride: 103 mmol/L (ref 96–106)
Creatinine, Ser: 0.66 mg/dL (ref 0.57–1.00)
GFR calc Af Amer: 107 mL/min/{1.73_m2} (ref >59)
GFR calc non Af Amer: 93 mL/min/{1.73_m2} (ref >59)
Globulin, Total: 2.5 g/dL (ref 1.5–4.5)
Glucose: 98 mg/dL (ref 65–99)
Potassium: 3.8 mmol/L (ref 3.5–5.2)
Sodium: 138 mmol/L (ref 134–144)
Total Protein: 6.6 g/dL (ref 6.0–8.5)

## 2018-05-06 LAB — CBC WITH DIFFERENTIAL/PLATELET
Basophils Absolute: 0 10*3/uL (ref 0.0–0.2)
Basos: 0 %
EOS (ABSOLUTE): 0.2 10*3/uL (ref 0.0–0.4)
Eos: 2 %
Hematocrit: 35.9 % (ref 34.0–46.6)
Hemoglobin: 12.1 g/dL (ref 11.1–15.9)
Immature Grans (Abs): 0 10*3/uL (ref 0.0–0.1)
Immature Granulocytes: 0 %
Lymphocytes Absolute: 3.2 10*3/uL — ABNORMAL HIGH (ref 0.7–3.1)
Lymphs: 41 %
MCH: 31.8 pg (ref 26.6–33.0)
MCHC: 33.7 g/dL (ref 31.5–35.7)
MCV: 94 fL (ref 79–97)
Monocytes Absolute: 0.5 10*3/uL (ref 0.1–0.9)
Monocytes: 7 %
Neutrophils Absolute: 3.9 10*3/uL (ref 1.4–7.0)
Neutrophils: 50 %
Platelets: 395 10*3/uL (ref 150–450)
RBC: 3.81 x10E6/uL (ref 3.77–5.28)
RDW: 13.6 % (ref 12.3–15.4)
WBC: 7.8 10*3/uL (ref 3.4–10.8)

## 2018-05-06 LAB — TSH: TSH: 1.15 u[IU]/mL (ref 0.450–4.500)

## 2018-05-06 LAB — HEMOGLOBIN A1C
Est. average glucose Bld gHb Est-mCnc: 120 mg/dL
Hgb A1c MFr Bld: 5.8 % — ABNORMAL HIGH (ref 4.8–5.6)

## 2018-05-07 ENCOUNTER — Telehealth: Payer: Self-pay | Admitting: Family Medicine

## 2018-05-07 ENCOUNTER — Encounter: Payer: Self-pay | Admitting: Family Medicine

## 2018-05-07 NOTE — Telephone Encounter (Signed)
Pt returned call to [facility] and requested that she call her back when she can

## 2018-05-11 NOTE — Telephone Encounter (Signed)
Left message for pt to call me back. See result notes

## 2018-06-16 ENCOUNTER — Other Ambulatory Visit: Payer: Self-pay | Admitting: Family Medicine

## 2018-06-16 DIAGNOSIS — Z1231 Encounter for screening mammogram for malignant neoplasm of breast: Secondary | ICD-10-CM

## 2018-07-24 ENCOUNTER — Other Ambulatory Visit: Payer: Self-pay | Admitting: Family Medicine

## 2018-07-24 DIAGNOSIS — K219 Gastro-esophageal reflux disease without esophagitis: Secondary | ICD-10-CM

## 2018-09-08 ENCOUNTER — Other Ambulatory Visit: Payer: Self-pay | Admitting: Family Medicine

## 2018-09-08 DIAGNOSIS — F411 Generalized anxiety disorder: Secondary | ICD-10-CM

## 2018-09-29 ENCOUNTER — Telehealth: Payer: Self-pay | Admitting: Family Medicine

## 2018-09-29 NOTE — Telephone Encounter (Signed)
Pt called and states that the nexium she is getting is to high it is 90 dollars for 3 month and she can not afford that , she is wanting to know if she can get Omeprazole 40 mg prescribed to her  She states that it is cheaper, pt can be reached at [phone] and [facility] uses [id number] - [place], [place] - [address] informed pt that you was out of the office today

## 2018-09-30 MED ORDER — OMEPRAZOLE 40 MG PO CPDR: 40.0000 mg | DELAYED_RELEASE_CAPSULE | Freq: Every day | ORAL | 1 refills | Status: DC

## 2018-09-30 NOTE — Telephone Encounter (Signed)
Tried to call pt but could not get through

## 2018-09-30 NOTE — Telephone Encounter (Signed)
Please take care of this for her. Thanks.  

## 2018-10-24 ENCOUNTER — Other Ambulatory Visit: Payer: Self-pay | Admitting: Family Medicine

## 2018-12-01 ENCOUNTER — Other Ambulatory Visit: Payer: Self-pay | Admitting: Family Medicine

## 2018-12-16 ENCOUNTER — Ambulatory Visit: Payer: 59

## 2018-12-31 ENCOUNTER — Ambulatory Visit (INDEPENDENT_AMBULATORY_CARE_PROVIDER_SITE_OTHER): Payer: PPO | Admitting: Family Medicine

## 2018-12-31 VITALS — BP 120/78 | HR 78 | Temp 97.8°F | Resp 16 | Wt 162.0 lb

## 2018-12-31 DIAGNOSIS — N3001 Acute cystitis with hematuria: Secondary | ICD-10-CM

## 2018-12-31 DIAGNOSIS — R300 Dysuria: Secondary | ICD-10-CM | POA: Diagnosis not present

## 2018-12-31 LAB — POCT URINALYSIS DIP (PROADVANTAGE DEVICE)
Bilirubin, UA: NEGATIVE
Glucose, UA: NEGATIVE mg/dL
Ketones, POC UA: NEGATIVE mg/dL
Nitrite, UA: NEGATIVE
Specific Gravity, Urine: 1.03
Urobilinogen, Ur: NEGATIVE
pH, UA: 6 (ref 5.0–8.0)

## 2018-12-31 MED ORDER — SULFAMETHOXAZOLE-TRIMETHOPRIM 800-160 MG PO TABS: 1.0000 | ORAL_TABLET | Freq: Two times a day (BID) | ORAL | 0 refills | Status: DC

## 2018-12-31 NOTE — Progress Notes (Signed)
   Subjective:    Patient ID: Tammy [name], female    DOB: 04-Sep-1951, 68 y.o.   MRN: [id number]  HPI Complains of a 2-day history of urinary urgency, frequency dysuria but no fever, chills  Review of Systems     Objective:   Physical Exam Alert and in no distress otherwise not examined Urine microscopic showed red and white blood cells.       Assessment & Plan:  Acute cystitis with hematuria - Plan: sulfamethoxazole-trimethoprim (BACTRIM DS,SEPTRA DS) 800-160 MG tablet  Dysuria - Plan: POCT Urinalysis DIP (Proadvantage Device) She is to take all the antibiotic and come back here for a nurse visit in 2 weeks.

## 2019-01-14 ENCOUNTER — Other Ambulatory Visit (INDEPENDENT_AMBULATORY_CARE_PROVIDER_SITE_OTHER): Payer: PPO

## 2019-01-14 ENCOUNTER — Other Ambulatory Visit: Payer: Self-pay | Admitting: Internal Medicine

## 2019-01-14 DIAGNOSIS — R319 Hematuria, unspecified: Secondary | ICD-10-CM | POA: Diagnosis not present

## 2019-01-14 LAB — POCT URINALYSIS DIP (PROADVANTAGE DEVICE)
Bilirubin, UA: NEGATIVE
Blood, UA: NEGATIVE
Glucose, UA: NEGATIVE mg/dL
Ketones, POC UA: NEGATIVE mg/dL
Leukocytes, UA: NEGATIVE
Nitrite, UA: NEGATIVE
Protein Ur, POC: NEGATIVE mg/dL
Specific Gravity, Urine: 1.02
Urobilinogen, Ur: NEGATIVE
pH, UA: 5.5 (ref 5.0–8.0)

## 2019-01-15 ENCOUNTER — Other Ambulatory Visit: Payer: Self-pay | Admitting: Family Medicine

## 2019-01-15 DIAGNOSIS — F411 Generalized anxiety disorder: Secondary | ICD-10-CM

## 2019-01-27 ENCOUNTER — Encounter: Payer: Self-pay | Admitting: Family Medicine

## 2019-01-27 ENCOUNTER — Ambulatory Visit (INDEPENDENT_AMBULATORY_CARE_PROVIDER_SITE_OTHER): Payer: PPO | Admitting: Family Medicine

## 2019-01-27 VITALS — BP 110/68 | HR 99 | Temp 99.0°F | Resp 16 | Wt 162.0 lb

## 2019-01-27 DIAGNOSIS — R52 Pain, unspecified: Secondary | ICD-10-CM | POA: Diagnosis not present

## 2019-01-27 DIAGNOSIS — J101 Influenza due to other identified influenza virus with other respiratory manifestations: Secondary | ICD-10-CM | POA: Diagnosis not present

## 2019-01-27 DIAGNOSIS — R6889 Other general symptoms and signs: Secondary | ICD-10-CM | POA: Diagnosis not present

## 2019-01-27 LAB — POCT INFLUENZA A/B
Influenza A, POC: NEGATIVE
Influenza B, POC: POSITIVE — AB

## 2019-01-27 NOTE — Progress Notes (Signed)
Chief Complaint  Patient presents with  . sick    possible flu, been sick since monday- headache, chills, body aches, cough, alittle fever    Subjective:  Tammy [name] is a 68 y.o. female who presents for a 6 day history of headache, fever, chills, body aches, rhinorrhea, nasal congestion, mild cough.   Denies dizziness, ear pain, sore throat, chest pain, palpitations, shortness of breath, wheezing, abdominal pain, nausea, vomiting, diarrhea, urinary symptoms.  Treatment to date: alka seltzer plus cold medicine. Aleve.  ? sick contacts.  No other aggravating or relieving factors.  No other c/o.  ROS as in subjective.   Objective: Vitals:   01/27/19 1121  BP: 110/68  Pulse: 99  Resp: 16  Temp: 99 F (37.2 C)  SpO2: 97%    General appearance: Alert, WD/WN, no distress, mildly ill appearing                             Skin: warm, no rash                           Head: no sinus tenderness                            Eyes: conjunctiva normal, corneas clear, PERRLA                            Ears: pearly TMs, external ear canals normal                          Nose: septum midline, turbinates swollen, with erythema and clear discharge             Mouth/throat: MMM, tongue normal, mild pharyngeal erythema                           Neck: supple, no adenopathy, no thyromegaly, nontender                          Heart: RRR, normal S1, S2, no murmurs                         Lungs: CTA bilaterally, no wheezes, rales, or rhonchi      Assessment: Influenza B  Body aches - Plan: Influenza A/B  Flu-like symptoms - Plan: Influenza A/B    Plan: Discussed diagnosis and treatment of influenza B.  She is out of the timeframe for Tamiflu.  Suggested symptomatic OTC remedies.  Tylenol or Aleve, Mucinex, stay hydrated.  Discussed that she should start improving over the next 2 to 3 days and if she is worsening or does not feel as if she is getting better than she should follow-up with me on  Monday.  A work note was provided for today and tomorrow.

## 2019-01-27 NOTE — Patient Instructions (Signed)
You have influenza B.   Stay well hydrated. Rest and take over the counter medications for your symptoms such as Mucinex DM for cough and congestion if needed. Aleve or Tylenol for fever, chills, body aches, headache.   Return if you are not feeling much better by Monday or if you feel like you are getting worse.    Influenza, Adult Influenza is also called "the flu." It is an infection in the lungs, nose, and throat (respiratory tract). It is caused by a virus. The flu causes symptoms that are similar to symptoms of a cold. It also causes a high fever and body aches. The flu spreads easily from person to person (is contagious). Getting a flu shot (influenza vaccination) every year is the best way to prevent the flu. What are the causes? This condition is caused by the influenza virus. You can get the virus by:  Breathing in droplets that are in the air from the cough or sneeze of a person who has the virus.  Touching something that has the virus on it (is contaminated) and then touching your mouth, nose, or eyes. What increases the risk? Certain things may make you more likely to get the flu. These include:  Not washing your hands often.  Having close contact with many people during cold and flu season.  Touching your mouth, eyes, or nose without first washing your hands.  Not getting a flu shot every year. You may have a higher risk for the flu, along with serious problems such as a lung infection (pneumonia), if you:  Are older than 65.  Are pregnant.  Have a weakened disease-fighting system (immune system) because of a disease or taking certain medicines.  Have a long-term (chronic) illness, such as: ? Heart, kidney, or lung disease. ? Diabetes. ? Asthma.  Have a liver disorder.  Are very overweight (morbidly obese).  Have anemia. This is a condition that affects your red blood cells. What are the signs or symptoms? Symptoms usually begin suddenly and last 4-14 days.  They may include:  Fever and chills.  Headaches, body aches, or muscle aches.  Sore throat.  Cough.  Runny or stuffy (congested) nose.  Chest discomfort.  Not wanting to eat as much as normal (poor appetite).  Weakness or feeling tired (fatigue).  Dizziness.  Feeling sick to your stomach (nauseous) or throwing up (vomiting). How is this treated? If the flu is found early, you can be treated with medicine that can help reduce how bad the illness is and how long it lasts (antiviral medicine). This may be given by mouth (orally) or through an IV tube. Taking care of yourself at home can help your symptoms get better. Your doctor may suggest:  Taking over-the-counter medicines.  Drinking plenty of fluids. The flu often goes away on its own. If you have very bad symptoms or other problems, you may be treated in a hospital. Follow these instructions at home:     Activity  Rest as needed. Get plenty of sleep.  Stay home from work or school as told by your doctor. ? Do not leave home until you do not have a fever for 24 hours without taking medicine. ? Leave home only to visit your doctor. Eating and drinking  Take an ORS (oral rehydration solution). This is a drink that is sold at pharmacies and stores.  Drink enough fluid to keep your pee (urine) pale yellow.  Drink clear fluids in small amounts as you are able.  Clear fluids include: ? Water. ? Ice chips. ? Fruit juice that has water added (diluted fruit juice). ? Low-calorie sports drinks.  Eat bland, easy-to-digest foods in small amounts as you are able. These foods include: ? Bananas. ? Applesauce. ? Rice. ? Lean meats. ? Toast. ? Crackers.  Do not eat or drink: ? Fluids that have a lot of sugar or caffeine. ? Alcohol. ? Spicy or fatty foods. General instructions  Take over-the-counter and prescription medicines only as told by your doctor.  Use a cool mist humidifier to add moisture to the air in your  home. This can make it easier for you to breathe.  Cover your mouth and nose when you cough or sneeze.  Wash your hands with soap and water often, especially after you cough or sneeze. If you cannot use soap and water, use alcohol-based hand sanitizer.  Keep all follow-up visits as told by your doctor. This is important. How is this prevented?   Get a flu shot every year. You may get the flu shot in late summer, fall, or winter. Ask your doctor when you should get your flu shot.  Avoid contact with people who are sick during fall and winter (cold and flu season). Contact a doctor if:  You get new symptoms.  You have: ? Chest pain. ? Watery poop (diarrhea). ? A fever.  Your cough gets worse.  You start to have more mucus.  You feel sick to your stomach.  You throw up. Get help right away if you:  Have shortness of breath.  Have trouble breathing.  Have skin or nails that turn a bluish color.  Have very bad pain or stiffness in your neck.  Get a sudden headache.  Get sudden pain in your face or ear.  Cannot eat or drink without throwing up. Summary  Influenza ("the flu") is an infection in the lungs, nose, and throat. It is caused by a virus.  Take over-the-counter and prescription medicines only as told by your doctor.  Getting a flu shot every year is the best way to avoid getting the flu. This information is not intended to replace advice given to you by your health care provider. Make sure you discuss any questions you have with your health care provider. Document Released: 09/09/2008 Document Revised: 05/19/2018 Document Reviewed: 05/19/2018 Elsevier Interactive Patient Education  2019 [employer].

## 2019-01-31 ENCOUNTER — Ambulatory Visit (INDEPENDENT_AMBULATORY_CARE_PROVIDER_SITE_OTHER): Payer: PPO | Admitting: Family Medicine

## 2019-01-31 ENCOUNTER — Encounter: Payer: Self-pay | Admitting: Family Medicine

## 2019-01-31 VITALS — BP 102/62 | HR 90 | Temp 98.2°F | Resp 16

## 2019-01-31 DIAGNOSIS — J101 Influenza due to other identified influenza virus with other respiratory manifestations: Secondary | ICD-10-CM

## 2019-01-31 DIAGNOSIS — R531 Weakness: Secondary | ICD-10-CM

## 2019-01-31 DIAGNOSIS — R197 Diarrhea, unspecified: Secondary | ICD-10-CM | POA: Diagnosis not present

## 2019-01-31 NOTE — Progress Notes (Signed)
   Subjective:    Patient ID: Tammy [name], female    DOB: May 29, 1951, 68 y.o.   MRN: [id number]  HPI Chief Complaint  Patient presents with  . follow-up    follow-up. weak, feeling alittle better   She is here due to persistent generalized weakness from flu last week. States she has been laying in the bed mostly since her illness.  States she was very tired just taking a shower today.  Felt short of breath at times.  Reports having a 2 day history of diarrhea. No blood or pus in her stool. Had 3 episodes yesterday and none today.  She ate a bowl of soup but overall intake is reduced.  Reports drinking fluids, normal urination and urine is a light yellow.   Denies fever, chills, headache, dizziness, chest pain, palpitations, abdominal pain, back pain, N/V, urinary symptoms.     Review of Systems Pertinent positives and negatives in the history of present illness.     Objective:   Physical Exam BP 102/62   Pulse 90   Temp 98.2 F (36.8 C) (Oral)   Resp 16   SpO2 96%   Alert and oriented and in no distress. No sinus tenderness. Tympanic membranes and canals are normal. Pharyngeal area is erythematous without edema or exudate.  Neck is supple without adenopathy or thyromegaly. Cardiac exam shows a regular sinus rhythm without murmurs or gallops. Lungs are clear to auscultation. Abdomen is soft, non distended, normal BS, non tender without rebound or guarding, no palpable masses. Extremities without edema, calves are symmetric, non tender. Skin is warm and dry, no pallor or rash. CNs intact, no focal weakness. Normal mood, speech and thought process.       Assessment & Plan:  Influenza B  Generalized weakness - Plan: CBC with Differential/Platelet, Comprehensive metabolic panel  Diarrhea, unspecified type - Plan: CBC with Differential/Platelet, Comprehensive metabolic panel  She was diagnosed with influenza B last week and here today to follow-up due to generalized weakness.   She is not in any acute distress.  No infectious process detected.  Generalized weakness is most likely due to inactivity for the past few days.  Encouraged her to increase fluids and foods as tolerated.  I also encouraged her to start walking more inside her house to increase exercise tolerance.  She had diarrhea yesterday but none today. We will check labs and have her follow-up in 2 days via telephone to let me know how she is feeling.

## 2019-01-31 NOTE — Patient Instructions (Signed)
The generalized weakness you have is most likely related to the prolonged rest you have been getting due to.  There is no sign of a worsening infection.  Let us check your labs and see how you are doing over the next couple of days.  Increase your fluids and food intake as tolerated and try to get up and walk around your house more often.  If you find any difficulty with this please let me know.

## 2019-02-01 LAB — CBC WITH DIFFERENTIAL/PLATELET
Basophils Absolute: 0 10*3/uL (ref 0.0–0.2)
Basos: 1 %
EOS (ABSOLUTE): 0 10*3/uL (ref 0.0–0.4)
Eos: 1 %
Hematocrit: 38.8 % (ref 34.0–46.6)
Hemoglobin: 13.5 g/dL (ref 11.1–15.9)
Immature Grans (Abs): 0 10*3/uL (ref 0.0–0.1)
Immature Granulocytes: 1 %
Lymphocytes Absolute: 1.9 10*3/uL (ref 0.7–3.1)
Lymphs: 47 %
MCH: 32.4 pg (ref 26.6–33.0)
MCHC: 34.8 g/dL (ref 31.5–35.7)
MCV: 93 fL (ref 79–97)
Monocytes Absolute: 0.3 10*3/uL (ref 0.1–0.9)
Monocytes: 8 %
Neutrophils Absolute: 1.6 10*3/uL (ref 1.4–7.0)
Neutrophils: 42 %
Platelets: 240 10*3/uL (ref 150–450)
RBC: 4.17 x10E6/uL (ref 3.77–5.28)
RDW: 12.3 % (ref 11.7–15.4)
WBC: 3.9 10*3/uL (ref 3.4–10.8)

## 2019-02-01 LAB — COMPREHENSIVE METABOLIC PANEL
ALT: 27 [IU]/L (ref 0–32)
AST: 40 [IU]/L (ref 0–40)
Albumin/Globulin Ratio: 1.5 (ref 1.2–2.2)
Albumin: 4 g/dL (ref 3.8–4.8)
Alkaline Phosphatase: 61 [IU]/L (ref 39–117)
BUN/Creatinine Ratio: 14 (ref 12–28)
BUN: 11 mg/dL (ref 8–27)
Bilirubin Total: 0.3 mg/dL (ref 0.0–1.2)
CO2: 27 mmol/L (ref 20–29)
Calcium: 8.8 mg/dL (ref 8.7–10.3)
Chloride: 101 mmol/L (ref 96–106)
Creatinine, Ser: 0.8 mg/dL (ref 0.57–1.00)
GFR calc Af Amer: 89 mL/min/{1.73_m2} (ref >59)
GFR calc non Af Amer: 77 mL/min/{1.73_m2} (ref >59)
Globulin, Total: 2.6 g/dL (ref 1.5–4.5)
Glucose: 96 mg/dL (ref 65–99)
Potassium: 3.8 mmol/L (ref 3.5–5.2)
Sodium: 142 mmol/L (ref 134–144)
Total Protein: 6.6 g/dL (ref 6.0–8.5)

## 2019-03-21 ENCOUNTER — Other Ambulatory Visit: Payer: Self-pay | Admitting: Family Medicine

## 2019-03-21 DIAGNOSIS — F411 Generalized anxiety disorder: Secondary | ICD-10-CM

## 2019-03-21 NOTE — Telephone Encounter (Signed)
Is this okay to refill? 

## 2019-03-21 NOTE — Telephone Encounter (Signed)
Please have her schedule a virtual med check. Thanks.

## 2019-03-21 NOTE — Telephone Encounter (Signed)
Called patient and scheduled her for 03/23/2019 @ 2:30pm. I tried to schedule her for Thurs am but she really wanted to do a virtual visit Wed afternoon.

## 2019-03-21 NOTE — Telephone Encounter (Signed)
Should I refuse until visit?

## 2019-03-23 ENCOUNTER — Encounter: Payer: Self-pay | Admitting: Family Medicine

## 2019-03-23 ENCOUNTER — Other Ambulatory Visit: Payer: Self-pay

## 2019-03-23 ENCOUNTER — Ambulatory Visit (INDEPENDENT_AMBULATORY_CARE_PROVIDER_SITE_OTHER): Payer: PPO | Admitting: Family Medicine

## 2019-03-23 VITALS — Wt 165.0 lb

## 2019-03-23 DIAGNOSIS — F411 Generalized anxiety disorder: Secondary | ICD-10-CM

## 2019-03-23 DIAGNOSIS — F329 Major depressive disorder, single episode, unspecified: Secondary | ICD-10-CM

## 2019-03-23 DIAGNOSIS — F32A Depression, unspecified: Secondary | ICD-10-CM

## 2019-03-23 MED ORDER — ALPRAZOLAM 0.5 MG PO TABS: 0.5000 mg | ORAL_TABLET | Freq: Every day | ORAL | 0 refills | Status: DC | PRN

## 2019-03-23 NOTE — Progress Notes (Signed)
   Subjective:    Patient ID: Tammy [name], female    DOB: Jan 05, 1951, 68 y.o.   MRN: [id number]  HPI Chief Complaint  Patient presents with  . med check    med check. needs a refill on xanax   Interactive audio and video telecommunications were attempted between this provider and patient, however due to patient having issues with the Zoom app, they did not have access to video capability.  We continued and completed visit with audio only.  The patient was located at home. 2 patient identifiers used.  The provider was located in the office. The patient did consent to this visit and is aware of possible charges through their insurance for this visit.  The other persons participating in this telemedicine service were none.  GAD- ongoing for many years. Not worsening.  Requests refill on Xanax. States she has not needed Xanax in several weeks but she feels like she may need it now that she has concerns about the current pandemic.   Taking Lexapro 20 mg nightly and questions whether it is working or not. She has not been taking this for years for depression, since her mother passed away. No side effects.   Denies alcohol or drug use.   Denies fever, chills, dizziness, chest pain, palpitations, shortness of breath, abdominal pain, N/V/D, urinary symptoms, LE edema.    Retired February 24th. Happy about this. Spending more time with grandchildren.   Reviewed allergies, medications, past medical, surgical, family, and social history.   Review of Systems Pertinent positives and negatives in the history of present illness.     Objective:   Physical Exam Wt 165 lb (74.8 kg)   BMI 29.23 kg/m   Alert and oriented and in no acute distress.  Respirations unlabored.  Normal speech, mood and thought process.  Denies SI.       Assessment & Plan:  GAD (generalized anxiety disorder) - Plan: ALPRAZolam (XANAX) 0.5 MG tablet  Depression, unspecified depression type  She is a long  history of anxiety and depression.  She will continue on Lexapro.  She is considering coming off of it at some point but I do not recommend doing that at this time.  Discussed weaning off of it down the road.  Refilled alprazolam.  She is not self-medicating.  No thoughts of self-harm.  I will pull up the drug registry to confirm no aberrancies.  Follow up in June as scheduled.   This virtual service is not related to other E/M service within previous 7 days.

## 2019-04-18 ENCOUNTER — Other Ambulatory Visit: Payer: Self-pay | Admitting: Family Medicine

## 2019-04-18 DIAGNOSIS — F411 Generalized anxiety disorder: Secondary | ICD-10-CM

## 2019-04-18 NOTE — Telephone Encounter (Signed)
Is this okay to refill? 

## 2019-05-31 ENCOUNTER — Ambulatory Visit: Payer: 59 | Admitting: Family Medicine

## 2019-06-01 NOTE — Progress Notes (Signed)
Tammy [name] is a 68 y.o. female who presents for annual wellness visit, CPE and follow-up on chronic medical conditions.  She has the following concerns:  Reports chronically having low energy. States she has more good days than bad. Has desire to do more than she has energy to do.  Taking escitalopram 20 mg for depression.  Alprazolam- rarely takes this.    Immunization History  Administered Date(s) Administered  . Pneumococcal Polysaccharide-23 06/02/2019   Last Pap smear: 2016- never wants to have another one Last mammogram: 2017. She is scheduled  Last colonoscopy: done 6 years ago. Good until 68 years old Last DEXA: 2016 normal   Dentist: years. Last one retired Ophtho: my eye dr in [place] Exercise: walks most days  Other doctors caring for patient include: Dr. Collene Mares- GI  OB[facility]N- [place] (retired)    Depression screen:  See questionnaire below.  Depression screen [facility] 2/9 06/02/2019 03/23/2019 05/05/2018  Decreased Interest 0 0 0  Down, Depressed, Hopeless 0 0 0  PHQ - 2 Score 0 0 0    Fall Risk Screen: see questionnaire below. Fall Risk  06/02/2019 05/05/2018  Falls in the past year? 0 Yes  Number falls in past yr: 0 1  Injury with Fall? 0 Yes    ADL screen:  See questionnaire below Functional Status Survey: Is the patient deaf or have difficulty hearing?: No Does the patient have difficulty seeing, even when wearing glasses/contacts?: No Does the patient have difficulty concentrating, remembering, or making decisions?: No Does the patient have difficulty walking or climbing stairs?: No Does the patient have difficulty dressing or bathing?: No Does the patient have difficulty doing errands alone such as visiting a doctor's office or shopping?: No   End of Life Discussion:  Patient does not have a living will and medical power of attorney. Her son Tammy [name] is her next of kin who would make her health care decisions.   Review of Systems Constitutional: -fever,  -chills, -sweats, -unexpected weight change, -anorexia, +fatigue Allergy: -sneezing, -itching, -congestion Dermatology: denies changing moles, rash, lumps, new worrisome lesions ENT: -runny nose, -ear pain, -sore throat, -hoarseness, -sinus pain, -teeth pain, -tinnitus, -hearing loss, -epistaxis Cardiology:  -chest pain, -palpitations, -edema, -orthopnea, -paroxysmal nocturnal dyspnea Respiratory: -cough, -shortness of breath, -dyspnea on exertion, -wheezing, -hemoptysis Gastroenterology: -abdominal pain, -nausea, -vomiting, -diarrhea, -constipation, -blood in stool, -changes in bowel movement, -dysphagia Hematology: -bleeding or bruising problems Musculoskeletal: -arthralgias, -myalgias, -joint swelling, -back pain, -neck pain, -cramping, -gait changes Ophthalmology: -vision changes, -eye redness, -itching, -discharge Urology: -dysuria, -difficulty urinating, -hematuria, -urinary frequency, -urgency, incontinence Neurology: -headache, -weakness, -tingling, -numbness, -speech abnormality, -memory loss, -falls, -dizziness Psychology:  -depressed mood, -agitation, -sleep problems    PHYSICAL EXAM:  BP 120/70   Pulse 72   Temp 98 F (36.7 C)   Ht 5' 3.5" (1.613 m)   Wt 162 lb (73.5 kg)   BMI 28.25 kg/m   General Appearance: Alert, cooperative, no distress, appears stated age Head: Normocephalic, without obvious abnormality, atraumatic Eyes: PERRL, conjunctiva/corneas clear, EOM's intact, fundi benign Ears: Normal TM's and external ear canals Nose: Nares normal, mucosa normal, no drainage or sinus   tenderness Throat: Lips, mucosa, and tongue normal; teeth and gums normal Neck: Supple, no lymphadenopathy; thyroid: no enlargement/tenderness/nodules; no carotid bruit or JVD Back: Spine nontender, no curvature, ROM normal, no CVA tenderness Lungs: Clear to auscultation bilaterally without wheezes, rales or ronchi; respirations unlabored Chest Wall: No tenderness or deformity Heart:  Regular rate and rhythm,  S1 and S2 normal, no murmur, rub or gallop Breast Exam: declines. Will schedule mammogram.  Abdomen: Soft, non-tender, nondistended, normoactive bowel sounds, no masses, no hepatosplenomegaly Genitalia: refuses  Extremities: No clubbing, cyanosis or edema Pulses: 2+ and symmetric all extremities Skin: Skin color, texture, turgor normal, no rashes or lesions Lymph nodes: Cervical, supraclavicular, and axillary nodes normal Neurologic: CNII-XII intact, normal strength, sensation and gait; reflexes 2+ and symmetric throughout Psych: Normal mood, affect, hygiene and grooming.  ASSESSMENT/PLAN: Medicare annual wellness visit, initial - Plan: this is her initial wellness visit. She has had Medicare for more than 12 months but has not been in to have a welcome to medicare visit.   Prediabetes - Plan: Hemoglobin A1c, counseling on diet and exercise. Check Hgb A1c and follow up.   GAD (generalized anxiety disorder) - Plan: controlled. Continue Lexapro. Rarely takes Xanax.   Gastroesophageal reflux disease, esophagitis presence not specified - Plan: controlled with avoiding triggers. Takes omeprazole some days.   Depression, unspecified depression type - Plan: continue Lexapro. Declines counseling. May be contributing to fatigue.   Fatigue, unspecified type - Plan: CBC with Differential/Platelet, Comprehensive metabolic panel, TSH, T4, free, VITAMIN D 25 Hydroxy (Vit-D Deficiency, Fractures), Vitamin B12, Iron. No red flag symptoms. rule out underlying etiology. Consider depression.   Need for hepatitis C screening test - Plan: Hepatitis C antibody, per guidelines.   Screening for breast cancer - Plan: she will call and schedule mammogram.   Need for prophylactic vaccination against Streptococcus pneumoniae (pneumococcus) - Plan: Pneumococcal polysaccharide vaccine 23-valent greater than or equal to 2yo subcutaneous/IM. Counseling on vaccine and potential side effects. She  will need Prevnar next year.   Advance directive discussed with patient - Plan: she will take home the advance directive forms. We discussed these. Is not willing to fill out MOST form. Her son is her closest kin.   Immunization counseling - Plan: prescription given for Tdap and Shingrix. Counseling done.   Routine general medical examination at a health care facility - Plan: CBC with Differential/Platelet, Comprehensive metabolic panel, TSH, T4, free, Lipid panel. Does not want another pap smear. DEXA normal in 2016. Will get mammogram. Discussed that there is not a good screening test for ovarian cancer. She declines pelvic exam. Discussed healthy lifestyle. Discussed safety.   Screening for lipid disorders - Plan: Lipid panel. Follow up pending result.    Discussed monthly self breast exams and yearly mammograms; at least 30 minutes of aerobic activity at least 5 days/week and weight-bearing exercise 2x/week; proper sunscreen use reviewed; healthy diet, including goals of calcium and vitamin D intake and alcohol recommendations (less than or equal to 1 drink/day) reviewed; regular seatbelt use; changing batteries in smoke detectors.  Immunization recommendations discussed.  Colonoscopy recommendations reviewed   Medicare Attestation I have personally reviewed: The patient's medical and social history Their use of alcohol, tobacco or illicit drugs Their current medications and supplements The patient's functional ability including ADLs,fall risks, home safety risks, cognitive, and hearing and visual impairment Diet and physical activities Evidence for depression or mood disorders  The patient's weight, height, and BMI have been recorded in the chart.  I have made referrals, counseling, and provided education to the patient based on review of the above and I have provided the patient with a written personalized care plan for preventive services.     Hetty BlendVickie Chioma Mukherjee, NP-C   06/02/2019

## 2019-06-02 ENCOUNTER — Ambulatory Visit (INDEPENDENT_AMBULATORY_CARE_PROVIDER_SITE_OTHER): Payer: PPO | Admitting: Family Medicine

## 2019-06-02 ENCOUNTER — Encounter: Payer: Self-pay | Admitting: Family Medicine

## 2019-06-02 ENCOUNTER — Other Ambulatory Visit: Payer: Self-pay

## 2019-06-02 VITALS — BP 120/70 | HR 72 | Temp 98.0°F | Ht 63.5 in | Wt 162.0 lb

## 2019-06-02 DIAGNOSIS — R5383 Other fatigue: Secondary | ICD-10-CM

## 2019-06-02 DIAGNOSIS — K219 Gastro-esophageal reflux disease without esophagitis: Secondary | ICD-10-CM

## 2019-06-02 DIAGNOSIS — Z0000 Encounter for general adult medical examination without abnormal findings: Secondary | ICD-10-CM

## 2019-06-02 DIAGNOSIS — F32A Depression, unspecified: Secondary | ICD-10-CM

## 2019-06-02 DIAGNOSIS — R7303 Prediabetes: Secondary | ICD-10-CM | POA: Diagnosis not present

## 2019-06-02 DIAGNOSIS — Z1159 Encounter for screening for other viral diseases: Secondary | ICD-10-CM | POA: Diagnosis not present

## 2019-06-02 DIAGNOSIS — Z13220 Encounter for screening for lipoid disorders: Secondary | ICD-10-CM

## 2019-06-02 DIAGNOSIS — Z7185 Encounter for immunization safety counseling: Secondary | ICD-10-CM

## 2019-06-02 DIAGNOSIS — F411 Generalized anxiety disorder: Secondary | ICD-10-CM

## 2019-06-02 DIAGNOSIS — F329 Major depressive disorder, single episode, unspecified: Secondary | ICD-10-CM

## 2019-06-02 DIAGNOSIS — Z1239 Encounter for other screening for malignant neoplasm of breast: Secondary | ICD-10-CM | POA: Diagnosis not present

## 2019-06-02 DIAGNOSIS — Z7189 Other specified counseling: Secondary | ICD-10-CM

## 2019-06-02 DIAGNOSIS — Z23 Encounter for immunization: Secondary | ICD-10-CM

## 2019-06-02 NOTE — Patient Instructions (Signed)
Call and schedule your mammogram as discussed.   You can take the prescription to your pharmacy for Tdap and Shingrix vaccines. You should check with your insurance carrier as to what your co-pay would be for the vaccines before getting these.   You are up to date on your colonoscopy, pap smear and bone density tests.   You are getting the pneumovax 23 vaccine today. You will need the Prevnar 13 next year per records.   Return for a flu shot this fall.   You may return the advance directives at your convenience and call and schedule a visit if you need assistance filling these out.   We will call you with your lab results.    Preventive Care 16 Years and Older, Female Preventive care refers to lifestyle choices and visits with your health care provider that can promote health and wellness. What does preventive care include?  A yearly physical exam. This is also called an annual well check.  Dental exams once or twice a year.  Routine eye exams. Ask your health care provider how often you should have your eyes checked.  Personal lifestyle choices, including: ? Daily care of your teeth and gums. ? Regular physical activity. ? Eating a healthy diet. ? Avoiding tobacco and drug use. ? Limiting alcohol use. ? Practicing safe sex. ? Taking low-dose aspirin every day. ? Taking vitamin and mineral supplements as recommended by your health care provider. What happens during an annual well check? The services and screenings done by your health care provider during your annual well check will depend on your age, overall health, lifestyle risk factors, and family history of disease. Counseling Your health care provider may ask you questions about your:  Alcohol use.  Tobacco use.  Drug use.  Emotional well-being.  Home and relationship well-being.  Sexual activity.  Eating habits.  History of falls.  Memory and ability to understand (cognition).  Work and work  Statistician.  Reproductive health.  Screening You may have the following tests or measurements:  Height, weight, and BMI.  Blood pressure.  Lipid and cholesterol levels. These may be checked every 5 years, or more frequently if you are over 3 years old.  Skin check.  Lung cancer screening. You may have this screening every year starting at age 79 if you have a 30-pack-year history of smoking and currently smoke or have quit within the past 15 years.  Colorectal cancer screening. All adults should have this screening starting at age 59 and continuing until age 32. You will have tests every 1-10 years, depending on your results and the type of screening test. People at increased risk should start screening at an earlier age. Screening tests may include: ? Guaiac-based fecal occult blood testing. ? Fecal immunochemical test (FIT). ? Stool DNA test. ? Virtual colonoscopy. ? Sigmoidoscopy. During this test, a flexible tube with a tiny camera (sigmoidoscope) is used to examine your rectum and lower colon. The sigmoidoscope is inserted through your anus into your rectum and lower colon. ? Colonoscopy. During this test, a long, thin, flexible tube with a tiny camera (colonoscope) is used to examine your entire colon and rectum.  Hepatitis C blood test.  Hepatitis B blood test.  Sexually transmitted disease (STD) testing.  Diabetes screening. This is done by checking your blood sugar (glucose) after you have not eaten for a while (fasting). You may have this done every 1-3 years.  Bone density scan. This is done to screen for osteoporosis.  You may have this done starting at age 36.  Mammogram. This may be done every 1-2 years. Talk to your health care provider about how often you should have regular mammograms. Talk with your health care provider about your test results, treatment options, and if necessary, the need for more tests. Vaccines Your health care provider may recommend  certain vaccines, such as:  Influenza vaccine. This is recommended every year.  Tetanus, diphtheria, and acellular pertussis (Tdap, Td) vaccine. You may need a Td booster every 10 years.  Varicella vaccine. You may need this if you have not been vaccinated.  Zoster vaccine. You may need this after age 53.  Measles, mumps, and rubella (MMR) vaccine. You may need at least one dose of MMR if you were born in 1957 or later. You may also need a second dose.  Pneumococcal 13-valent conjugate (PCV13) vaccine. One dose is recommended after age 73.  Pneumococcal polysaccharide (PPSV23) vaccine. One dose is recommended after age 49.  Meningococcal vaccine. You may need this if you have certain conditions.  Hepatitis A vaccine. You may need this if you have certain conditions or if you travel or work in places where you may be exposed to hepatitis A.  Hepatitis B vaccine. You may need this if you have certain conditions or if you travel or work in places where you may be exposed to hepatitis B.  Haemophilus influenzae type b (Hib) vaccine. You may need this if you have certain conditions. Talk to your health care provider about which screenings and vaccines you need and how often you need them. This information is not intended to replace advice given to you by your health care provider. Make sure you discuss any questions you have with your health care provider. Document Released: 12/28/2015 Document Revised: 01/21/2018 Document Reviewed: 10/02/2015 Elsevier Interactive Patient Education  2019 [employer].

## 2019-06-03 LAB — CBC WITH DIFFERENTIAL/PLATELET
Basophils Absolute: 0.1 10*3/uL (ref 0.0–0.2)
Basos: 1 %
EOS (ABSOLUTE): 0.1 10*3/uL (ref 0.0–0.4)
Eos: 2 %
Hematocrit: 38.1 % (ref 34.0–46.6)
Hemoglobin: 13.2 g/dL (ref 11.1–15.9)
Immature Grans (Abs): 0 10*3/uL (ref 0.0–0.1)
Immature Granulocytes: 0 %
Lymphocytes Absolute: 3 10*3/uL (ref 0.7–3.1)
Lymphs: 41 %
MCH: 32.1 pg (ref 26.6–33.0)
MCHC: 34.6 g/dL (ref 31.5–35.7)
MCV: 93 fL (ref 79–97)
Monocytes Absolute: 0.5 10*3/uL (ref 0.1–0.9)
Monocytes: 7 %
Neutrophils Absolute: 3.6 10*3/uL (ref 1.4–7.0)
Neutrophils: 49 %
Platelets: 379 10*3/uL (ref 150–450)
RBC: 4.11 x10E6/uL (ref 3.77–5.28)
RDW: 12.8 % (ref 11.7–15.4)
WBC: 7.3 10*3/uL (ref 3.4–10.8)

## 2019-06-03 LAB — COMPREHENSIVE METABOLIC PANEL
ALT: 19 [IU]/L (ref 0–32)
AST: 22 [IU]/L (ref 0–40)
Albumin/Globulin Ratio: 1.6 (ref 1.2–2.2)
Albumin: 4.1 g/dL (ref 3.8–4.8)
Alkaline Phosphatase: 69 [IU]/L (ref 39–117)
BUN/Creatinine Ratio: 24 (ref 12–28)
BUN: 18 mg/dL (ref 8–27)
Bilirubin Total: 0.5 mg/dL (ref 0.0–1.2)
CO2: 25 mmol/L (ref 20–29)
Calcium: 9.6 mg/dL (ref 8.7–10.3)
Chloride: 104 mmol/L (ref 96–106)
Creatinine, Ser: 0.75 mg/dL (ref 0.57–1.00)
GFR calc Af Amer: 96 mL/min/{1.73_m2} (ref >59)
GFR calc non Af Amer: 83 mL/min/{1.73_m2} (ref >59)
Globulin, Total: 2.5 g/dL (ref 1.5–4.5)
Glucose: 109 mg/dL — ABNORMAL HIGH (ref 65–99)
Potassium: 4.5 mmol/L (ref 3.5–5.2)
Sodium: 143 mmol/L (ref 134–144)
Total Protein: 6.6 g/dL (ref 6.0–8.5)

## 2019-06-03 LAB — HEMOGLOBIN A1C
Est. average glucose Bld gHb Est-mCnc: 117 mg/dL
Hgb A1c MFr Bld: 5.7 % — ABNORMAL HIGH (ref 4.8–5.6)

## 2019-06-03 LAB — TSH: TSH: 1.78 u[IU]/mL (ref 0.450–4.500)

## 2019-06-03 LAB — LIPID PANEL
Chol/HDL Ratio: 5.5 {ratio} — ABNORMAL HIGH (ref 0.0–4.4)
Cholesterol, Total: 232 mg/dL — ABNORMAL HIGH (ref 100–199)
HDL: 42 mg/dL (ref >39)
LDL Calculated: 156 mg/dL — ABNORMAL HIGH (ref 0–99)
Triglycerides: 172 mg/dL — ABNORMAL HIGH (ref 0–149)
VLDL Cholesterol Cal: 34 mg/dL (ref 5–40)

## 2019-06-03 LAB — T4, FREE: Free T4: 1.12 ng/dL (ref 0.82–1.77)

## 2019-06-03 LAB — VITAMIN B12: Vitamin B-12: 451 pg/mL (ref 232–1245)

## 2019-06-03 LAB — VITAMIN D 25 HYDROXY (VIT D DEFICIENCY, FRACTURES): Vit D, 25-Hydroxy: 26.3 ng/mL — ABNORMAL LOW (ref 30.0–100.0)

## 2019-06-03 LAB — IRON: Iron: 76 ug/dL (ref 27–139)

## 2019-06-03 LAB — HEPATITIS C ANTIBODY: Hep C Virus Ab: <0.1 {s_co_ratio} (ref 0.0–0.9)

## 2019-06-15 ENCOUNTER — Other Ambulatory Visit: Payer: Self-pay | Admitting: Family Medicine

## 2019-06-22 ENCOUNTER — Other Ambulatory Visit: Payer: Self-pay | Admitting: Family Medicine

## 2019-06-22 DIAGNOSIS — Z1231 Encounter for screening mammogram for malignant neoplasm of breast: Secondary | ICD-10-CM

## 2019-06-28 DIAGNOSIS — M720 Palmar fascial fibromatosis [Dupuytren]: Secondary | ICD-10-CM | POA: Diagnosis not present

## 2019-07-15 ENCOUNTER — Other Ambulatory Visit: Payer: Self-pay

## 2019-07-15 DIAGNOSIS — M720 Palmar fascial fibromatosis [Dupuytren]: Secondary | ICD-10-CM | POA: Diagnosis not present

## 2019-07-18 ENCOUNTER — Other Ambulatory Visit: Payer: Self-pay | Admitting: Family Medicine

## 2019-07-18 DIAGNOSIS — F411 Generalized anxiety disorder: Secondary | ICD-10-CM

## 2019-07-22 DIAGNOSIS — M720 Palmar fascial fibromatosis [Dupuytren]: Secondary | ICD-10-CM | POA: Diagnosis not present

## 2019-07-22 DIAGNOSIS — M25641 Stiffness of right hand, not elsewhere classified: Secondary | ICD-10-CM | POA: Diagnosis not present

## 2019-07-28 DIAGNOSIS — M25641 Stiffness of right hand, not elsewhere classified: Secondary | ICD-10-CM | POA: Diagnosis not present

## 2019-07-28 DIAGNOSIS — Z9889 Other specified postprocedural states: Secondary | ICD-10-CM | POA: Diagnosis not present

## 2019-07-28 DIAGNOSIS — M720 Palmar fascial fibromatosis [Dupuytren]: Secondary | ICD-10-CM | POA: Diagnosis not present

## 2019-08-05 ENCOUNTER — Ambulatory Visit: Payer: 59

## 2019-08-09 DIAGNOSIS — M25641 Stiffness of right hand, not elsewhere classified: Secondary | ICD-10-CM | POA: Diagnosis not present

## 2019-08-09 DIAGNOSIS — M720 Palmar fascial fibromatosis [Dupuytren]: Secondary | ICD-10-CM | POA: Diagnosis not present

## 2019-08-17 DIAGNOSIS — M720 Palmar fascial fibromatosis [Dupuytren]: Secondary | ICD-10-CM | POA: Diagnosis not present

## 2019-08-17 DIAGNOSIS — M25641 Stiffness of right hand, not elsewhere classified: Secondary | ICD-10-CM | POA: Diagnosis not present

## 2019-08-21 ENCOUNTER — Other Ambulatory Visit: Payer: Self-pay | Admitting: Family Medicine

## 2019-08-21 DIAGNOSIS — F411 Generalized anxiety disorder: Secondary | ICD-10-CM

## 2019-08-24 ENCOUNTER — Telehealth: Payer: Self-pay | Admitting: Family Medicine

## 2019-08-24 NOTE — Telephone Encounter (Signed)
Received a fax response to a continuity of care records request. They are requesting a signed release.

## 2019-08-25 DIAGNOSIS — M720 Palmar fascial fibromatosis [Dupuytren]: Secondary | ICD-10-CM | POA: Diagnosis not present

## 2019-09-06 DIAGNOSIS — M720 Palmar fascial fibromatosis [Dupuytren]: Secondary | ICD-10-CM | POA: Diagnosis not present

## 2019-09-06 DIAGNOSIS — M25641 Stiffness of right hand, not elsewhere classified: Secondary | ICD-10-CM | POA: Diagnosis not present

## 2019-10-13 ENCOUNTER — Ambulatory Visit: Payer: 59

## 2019-12-21 ENCOUNTER — Other Ambulatory Visit: Payer: Self-pay | Admitting: Family Medicine

## 2020-01-23 ENCOUNTER — Other Ambulatory Visit: Payer: Self-pay | Admitting: Family Medicine

## 2020-01-23 DIAGNOSIS — F411 Generalized anxiety disorder: Secondary | ICD-10-CM

## 2020-01-24 NOTE — Telephone Encounter (Signed)
Called and left message for pt to call back to schedule an appt. Request for colonoscopy from [facility] medical

## 2020-01-24 NOTE — Telephone Encounter (Signed)
She needs an office visit before the alprazolam can be refilled. Ok to give her omeprazole 30 days but she requested medical records in September 2020 to [facility], please find out what this was regarding. Thanks.

## 2020-01-24 NOTE — Telephone Encounter (Signed)
Is this okay to refill? 

## 2020-02-03 ENCOUNTER — Telehealth: Payer: Self-pay | Admitting: Family Medicine

## 2020-02-03 NOTE — Telephone Encounter (Signed)
Pt needs refill on Xanax and Lexapro sent to the [facility] on [address].

## 2020-02-03 NOTE — Telephone Encounter (Signed)
Ok to refill Lexapro. She needs a virtual visit to discuss Xanax and Lexapro refills.  need documentation since I have not seen her in over 6 months.

## 2020-02-03 NOTE — Telephone Encounter (Signed)
Pt has a virtual office visit Monday and can wait to get these refilled then

## 2020-02-06 ENCOUNTER — Ambulatory Visit (INDEPENDENT_AMBULATORY_CARE_PROVIDER_SITE_OTHER): Payer: PPO | Admitting: Family Medicine

## 2020-02-06 ENCOUNTER — Encounter: Payer: Self-pay | Admitting: Family Medicine

## 2020-02-06 ENCOUNTER — Other Ambulatory Visit: Payer: Self-pay

## 2020-02-06 VITALS — Wt 160.0 lb

## 2020-02-06 DIAGNOSIS — F411 Generalized anxiety disorder: Secondary | ICD-10-CM | POA: Diagnosis not present

## 2020-02-06 DIAGNOSIS — F329 Major depressive disorder, single episode, unspecified: Secondary | ICD-10-CM | POA: Diagnosis not present

## 2020-02-06 DIAGNOSIS — Z79899 Other long term (current) drug therapy: Secondary | ICD-10-CM

## 2020-02-06 DIAGNOSIS — F32A Depression, unspecified: Secondary | ICD-10-CM

## 2020-02-06 MED ORDER — ALPRAZOLAM 0.5 MG PO TABS: ORAL_TABLET | ORAL | 0 refills | Status: DC

## 2020-02-06 MED ORDER — ESCITALOPRAM OXALATE 20 MG PO TABS: 20.0000 mg | ORAL_TABLET | Freq: Every day | ORAL | 1 refills | Status: DC

## 2020-02-06 NOTE — Progress Notes (Signed)
   Subjective:  Documentation for virtual audio since she does not have access to video.   The patient was located at home. 2 patient identifiers used.  The provider was located in the office. The patient did consent to this visit and is aware of possible charges through their insurance for this visit.  The other persons participating in this telemedicine service were none.    Patient ID: Tammy [name], female    DOB: 10/06/1951, 69 y.o.   MRN: [id number]  HPI Chief Complaint  Patient presents with  . med check    med check, needs refills on xanax and lexapro . doesn't use xanax often   This is a follow up on anxiety and depression. States she still has issues with both but is managing ok.  States she has more good days than bad.  States she is able to function and take care of all of her ADLs but some days she has to push through because she doesn't really want to do them.  Reports taking Lexapro every night.   Takes Xanax sporadically. Denies getting medications from any other source.   Denies fever, chills, dizziness, chest pain, palpitations, shortness of breath, abdominal pain, N/V/D, urinary symptoms, LE edema.   Depression screen [facility] 2/9 06/02/2019 03/23/2019 05/05/2018  Decreased Interest 0 0 0  Down, Depressed, Hopeless 0 0 0  PHQ - 2 Score 0 0 0   Reviewed allergies, medications, past medical, surgical, family, and social history.   Review of Systems Pertinent positives and negatives in the history of present illness.     Objective:   Physical Exam Wt 160 lb (72.6 kg)   BMI 27.90 kg/m   Alert and oriented and in no acute distress.  Normal speech, mood and thought process.  Denies SI      Assessment & Plan:  GAD (generalized anxiety disorder) - Plan: escitalopram (LEXAPRO) 20 MG tablet, ALPRAZolam (XANAX) 0.5 MG tablet  Depression, unspecified depression type - Plan: escitalopram (LEXAPRO) 20 MG tablet  Medication management  This is a virtual visit to  follow-up on anxiety and depression.  She appears stable on her current medication regimen.  She does not take Xanax often.  We once again discussed counseling and she will think about it.  Encouraged her to follow-up if she is worsening or she will come in when due for AWV Reviewed PDMP and no aberrancies were found  Time spent on call was 10 minutes and in review of previous records 15 minutes total.  This virtual service is not related to other E/M service within previous 7 days.

## 2020-02-13 ENCOUNTER — Telehealth: Payer: Self-pay

## 2020-02-13 NOTE — Telephone Encounter (Signed)
Pt. Called LM stating she wanted to know if you could call her in a prescription for fever blisters. Pt. Last apt was 02/06/20.

## 2020-02-13 NOTE — Telephone Encounter (Signed)
Let's ask her to do a virtual visit to discuss this.

## 2020-02-14 ENCOUNTER — Encounter: Payer: Self-pay | Admitting: Family Medicine

## 2020-02-14 ENCOUNTER — Other Ambulatory Visit: Payer: Self-pay

## 2020-02-14 ENCOUNTER — Ambulatory Visit (INDEPENDENT_AMBULATORY_CARE_PROVIDER_SITE_OTHER): Payer: PPO | Admitting: Family Medicine

## 2020-02-14 VITALS — Wt 160.0 lb

## 2020-02-14 DIAGNOSIS — B001 Herpesviral vesicular dermatitis: Secondary | ICD-10-CM | POA: Diagnosis not present

## 2020-02-14 MED ORDER — VALACYCLOVIR HCL 1 G PO TABS: ORAL_TABLET | ORAL | 1 refills | Status: DC

## 2020-02-14 NOTE — Progress Notes (Signed)
   Subjective:  Documentation for virtual audio only due to patient not having access to video.  The patient was located at home. 2 patient identifiers used.  The provider was located in the office. The patient did consent to this visit and is aware of possible charges through their insurance for this visit.  The other persons participating in this telemedicine service were none.    Patient ID: Tammy [name], female    DOB: Oct 03, 1951, 69 y.o.   MRN: [id number]  HPI Chief Complaint  Patient presents with  . fever blister    fever blister, corner of mouth, gets them a couple times a year, popped up yesterday   Complains of 2 year history of intermittent cold sores. These occur 2 times per year typically.  States her cold sores are large and painful usually. OTC medication does not help much.   No other sores or issues.   Denies fever, chills, N/V/D.     Review of Systems Pertinent positives and negatives in the history of present illness.     Objective:   Physical Exam Wt 160 lb (72.6 kg)   BMI 27.90 kg/m   Alert and oriented in no acute distress      Assessment & Plan:  Herpes labialis - Plan: valACYclovir (VALTREX) 1000 MG tablet  Reviewed chart and she and I have never discussed this issue. Uncomplicated herpes labialis and she has never taken prescription medication for this. Discussed usual triggers. Discussed proper administration, potential side effects and to let me know if her outbreaks appear to be more frequent.  Time spent on call was  10 minutes and in review of previous records 12 minutes total.  This virtual service is not related to other E/M service within previous 7 days.

## 2020-02-14 NOTE — Telephone Encounter (Signed)
Pt. Has been scheduled for today at 1:30.

## 2020-03-14 ENCOUNTER — Encounter: Payer: Self-pay | Admitting: Family Medicine

## 2020-03-14 ENCOUNTER — Encounter (HOSPITAL_COMMUNITY): Payer: Self-pay

## 2020-03-14 ENCOUNTER — Other Ambulatory Visit: Payer: Self-pay

## 2020-03-14 ENCOUNTER — Emergency Department (HOSPITAL_COMMUNITY)
Admission: EM | Admit: 2020-03-14 | Discharge: 2020-03-14 | Disposition: A | Discharge status: Home / Self Care | Payer: PPO | Attending: Emergency Medicine | Admitting: Emergency Medicine

## 2020-03-14 ENCOUNTER — Ambulatory Visit (INDEPENDENT_AMBULATORY_CARE_PROVIDER_SITE_OTHER): Payer: PPO | Admitting: Family Medicine

## 2020-03-14 VITALS — BP 120/64 | HR 85 | Temp 97.6°F | Wt 152.2 lb

## 2020-03-14 DIAGNOSIS — R5381 Other malaise: Secondary | ICD-10-CM | POA: Diagnosis not present

## 2020-03-14 DIAGNOSIS — I959 Hypotension, unspecified: Secondary | ICD-10-CM | POA: Diagnosis not present

## 2020-03-14 DIAGNOSIS — E876 Hypokalemia: Secondary | ICD-10-CM | POA: Diagnosis not present

## 2020-03-14 DIAGNOSIS — I951 Orthostatic hypotension: Secondary | ICD-10-CM

## 2020-03-14 DIAGNOSIS — R531 Weakness: Secondary | ICD-10-CM | POA: Insufficient documentation

## 2020-03-14 DIAGNOSIS — R5383 Other fatigue: Secondary | ICD-10-CM | POA: Diagnosis not present

## 2020-03-14 DIAGNOSIS — R824 Acetonuria: Secondary | ICD-10-CM

## 2020-03-14 DIAGNOSIS — Z79899 Other long term (current) drug therapy: Secondary | ICD-10-CM | POA: Diagnosis not present

## 2020-03-14 DIAGNOSIS — R630 Anorexia: Secondary | ICD-10-CM | POA: Diagnosis not present

## 2020-03-14 LAB — CBC
HCT: 39.4 % (ref 36.0–46.0)
Hemoglobin: 13.9 g/dL (ref 12.0–15.0)
MCH: 31.9 pg (ref 26.0–34.0)
MCHC: 35.3 g/dL (ref 30.0–36.0)
MCV: 90.4 fL (ref 80.0–100.0)
Platelets: 161 10*3/uL (ref 150–400)
RBC: 4.36 MIL/uL (ref 3.87–5.11)
RDW: 11.7 % (ref 11.5–15.5)
WBC: 3.6 10*3/uL — ABNORMAL LOW (ref 4.0–10.5)
nRBC: 0 % (ref 0.0–0.2)

## 2020-03-14 LAB — BASIC METABOLIC PANEL
Anion gap: 15 (ref 5–15)
BUN: 18 mg/dL (ref 8–23)
CO2: 23 mmol/L (ref 22–32)
Calcium: 8.6 mg/dL — ABNORMAL LOW (ref 8.9–10.3)
Chloride: 97 mmol/L — ABNORMAL LOW (ref 98–111)
Creatinine, Ser: 0.9 mg/dL (ref 0.44–1.00)
GFR calc Af Amer: >60 mL/min (ref >60)
GFR calc non Af Amer: >60 mL/min (ref >60)
Glucose, Bld: 139 mg/dL — ABNORMAL HIGH (ref 70–99)
Potassium: 2.9 mmol/L — ABNORMAL LOW (ref 3.5–5.1)
Sodium: 135 mmol/L (ref 135–145)

## 2020-03-14 LAB — POCT GLYCOSYLATED HEMOGLOBIN (HGB A1C): Hemoglobin A1C: 5.9 % — AB (ref 4.0–5.6)

## 2020-03-14 LAB — POCT URINALYSIS DIP (PROADVANTAGE DEVICE)
Bilirubin, UA: NEGATIVE
Blood, UA: NEGATIVE
Glucose, UA: NEGATIVE mg/dL
Leukocytes, UA: NEGATIVE
Nitrite, UA: NEGATIVE
Specific Gravity, Urine: 1.025
Urobilinogen, Ur: NEGATIVE
pH, UA: 6 (ref 5.0–8.0)

## 2020-03-14 LAB — URINALYSIS, ROUTINE W REFLEX MICROSCOPIC
Bilirubin Urine: NEGATIVE
Glucose, UA: NEGATIVE mg/dL
Hgb urine dipstick: NEGATIVE
Ketones, ur: 5 mg/dL — AB
Leukocytes,Ua: NEGATIVE
Nitrite: NEGATIVE
Protein, ur: NEGATIVE mg/dL
Specific Gravity, Urine: 1.008 (ref 1.005–1.030)
pH: 6 (ref 5.0–8.0)

## 2020-03-14 LAB — POCT CBG (FASTING - GLUCOSE)-MANUAL ENTRY: Glucose Fasting, POC: 131 mg/dL — AB (ref 70–99)

## 2020-03-14 LAB — POCT HEMOGLOBIN: Hemoglobin: 14.2 g/dL (ref 11–14.6)

## 2020-03-14 MED ORDER — POTASSIUM CHLORIDE CRYS ER 20 MEQ PO TBCR
40.0000 meq | EXTENDED_RELEASE_TABLET | Freq: Once | ORAL | Status: AC
  Administered 2020-03-14: 40 meq via ORAL
  Filled 2020-03-14: qty 2

## 2020-03-14 MED ORDER — SODIUM CHLORIDE 0.9 % IV BOLUS
1000.0000 mL | Freq: Once | INTRAVENOUS | Status: AC
  Administered 2020-03-14: 1000 mL via INTRAVENOUS

## 2020-03-14 NOTE — Progress Notes (Signed)
   Subjective:    Patient ID: Tammy [name], female    DOB: 1951-01-18, 69 y.o.   MRN: [id number]  HPI Chief Complaint  Patient presents with  . very fatigue    very fatigue and feels she could just go to sleep, weak for last week,  dry mouth, no dizziness,no fever, no other symptoms   Complains of severe fatigue for the past 1 1/2 week. States her fatigue came on abruptly and has worsened over the past few days. She also reports a loss of appetite and dry throat.  No other symptoms. States she is not able to get up to do any of her usual tasks.   Denies fever, chills, dizziness, chest pain, palpitations, shortness of breath, abdominal pain, N/V/D, urinary symptoms, LE edema.  No loss of taste or smell.   She has not had her Covid vaccines nor a hx of Covid. No known exposure.   Reviewed allergies, medications, past medical, surgical, family, and social history.    Review of Systems Pertinent positives and negatives in the history of present illness.     Objective:   Physical Exam Constitutional:      Appearance: She is ill-appearing and toxic-appearing.  HENT:     Mouth/Throat:     Mouth: Mucous membranes are dry.  Eyes:     Extraocular Movements: Extraocular movements intact.     Conjunctiva/sclera: Conjunctivae normal.     Pupils: Pupils are equal, round, and reactive to light.  Cardiovascular:     Rate and Rhythm: Normal rate and regular rhythm.     Pulses: Normal pulses.  Pulmonary:     Effort: Pulmonary effort is normal.     Breath sounds: Normal breath sounds.  Musculoskeletal:     Cervical back: Normal range of motion and neck supple.  Skin:    General: Skin is warm and dry.     Capillary Refill: Capillary refill takes less than 2 seconds.  Neurological:     General: No focal deficit present.     Mental Status: She is alert.     Cranial Nerves: No cranial nerve deficit.     Sensory: No sensory deficit.     Motor: No weakness.  Psychiatric:        Mood and  Affect: Mood normal.        Thought Content: Thought content normal.    BP 120/64   Pulse 85   Temp 97.6 F (36.4 C)   Wt 152 lb 3.2 oz (69 kg)   SpO2 95%   BMI 26.54 kg/m         Assessment & Plan:  Orthostatic hypotension  Fatigue, unspecified type - Plan: POCT Urinalysis DIP (Proadvantage Device)  Loss of appetite  Ketonuria - Plan: POCT Urinalysis DIP (Proadvantage Device)  Generalized weakness  Fasting blood sugar finger stick 131  Hgb A1c 5.9% Hemoglobin finger stick 14.2   She is orthostatic and only able to provide a small amount of urine.  She did have 2+ketones and trace of protein.   No obvious explanation for her symptoms Discussed doing stat labs and Covid testing but she had difficulty ambulating to the restroom and her oxygen level dropped from 95% to 90% with ambulation. She is observably severely weak to the point that I recommended using a wheelchair. I do not feel that she will be able to rehydrate orally.  Her sister in law will drive her to the [facility] ED for further work up.

## 2020-03-14 NOTE — ED Provider Notes (Signed)
[facility] EMERGENCY DEPARTMENT Provider Note   CSN: [id number] Arrival date & time: 03/14/20  1204     History Chief Complaint  Patient presents with  . Orthostatic Hypotension    Tammy [name] is a 69 y.o. female.  HPI Patient presents for evaluation of generalized weakness and tiredness.  She went to see her PCP today who found that she was orthostatic.  Apparently she was also hypoxic, 90% while walking.  She arrived by private vehicle, hypotensive which then improved after sitting.  Patient reports she has had decreased tolerance of food over the last week and she is not sure why.  She can physically chew and swallow and not vomit.  She states she has been drinking fluids well.  Her bowel movements have been sporadic, over the last week, during which time she has not been eating much food.  She denies fever, chills, vomiting, diarrhea, constipation, change in urinary habits or difficulty urinating.  No known sick contacts.  She lives with her son.  She came here by private vehicle.  There are no other known modifying factors.    Past Medical History:  Diagnosis Date  . Depression   . GAD (generalized anxiety disorder)   . GERD (gastroesophageal reflux disease)   . Prediabetes     Patient Active Problem List   Diagnosis Date Noted  . GERD (gastroesophageal reflux disease)   . Depression 03/23/2019  . Gastroesophageal reflux disease 05/05/2018  . GAD (generalized anxiety disorder)   . Prediabetes     Past Surgical History:  Procedure Laterality Date  . CESAREAN SECTION       OB History   No obstetric history on file.     History reviewed. No pertinent family history.  Social History   Tobacco Use  . Smoking status: Never Smoker  . Smokeless tobacco: Never Used  Substance Use Topics  . Alcohol use: No  . Drug use: No    Home Medications Prior to Admission medications   Medication Sig Start Date End Date Taking? Authorizing Provider   ALPRAZolam (XANAX) 0.5 MG tablet TAKE 1 TABLET BY MOUTH DAILY AS NEEDED FOR ANXIETY Patient taking differently: Take 0.5 mg by mouth daily as needed for anxiety.  02/06/20  Yes Henson, Vickie L, NP-C  escitalopram (LEXAPRO) 20 MG tablet Take 1 tablet (20 mg total) by mouth daily. 02/06/20  Yes Henson, Vickie L, NP-C  omeprazole (PRILOSEC) 40 MG capsule TAKE 1 CAPSULE BY MOUTH EVERY DAY Patient taking differently: Take 40 mg by mouth daily.  01/24/20  Yes Henson, Vickie L, NP-C  valACYclovir (VALTREX) 1000 MG tablet Take 2 tablets (2,000 mg) by mouth every 12 hours x 1 day at onset of symptoms. Patient not taking: Reported on 03/14/2020 02/14/20   Girtha Rm, NP-C    Allergies    Patient has no known allergies.  Review of Systems   Review of Systems  All other systems reviewed and are negative.   Physical Exam Updated Vital Signs BP (!) 91/44   Pulse 79   Temp 98 F (36.7 C) (Oral)   Resp 20   Ht 5\' 3"  (1.6 m)   Wt 68 kg   SpO2 97%   BMI 26.57 kg/m   Physical Exam Vitals and nursing note reviewed.  Constitutional:      General: She is not in acute distress.    Appearance: She is well-developed. She is ill-appearing. She is not toxic-appearing or diaphoretic.  HENT:  Head: Normocephalic and atraumatic.     Right Ear: External ear normal.     Left Ear: External ear normal.     Mouth/Throat:     Mouth: Mucous membranes are dry.  Eyes:     Conjunctiva/sclera: Conjunctivae normal.     Pupils: Pupils are equal, round, and reactive to light.  Neck:     Trachea: Phonation normal.  Cardiovascular:     Rate and Rhythm: Normal rate and regular rhythm.     Heart sounds: Normal heart sounds.  Pulmonary:     Effort: Pulmonary effort is normal.     Breath sounds: Normal breath sounds.  Abdominal:     General: There is no distension.     Palpations: Abdomen is soft. There is no mass.     Tenderness: There is no abdominal tenderness. There is no guarding.  Musculoskeletal:         General: Normal range of motion.     Cervical back: Normal range of motion and neck supple.  Skin:    General: Skin is warm and dry.  Neurological:     Mental Status: She is alert and oriented to person, place, and time.     Cranial Nerves: No cranial nerve deficit.     Sensory: No sensory deficit.     Motor: No abnormal muscle tone.     Coordination: Coordination normal.  Psychiatric:        Mood and Affect: Mood normal.        Behavior: Behavior normal.        Thought Content: Thought content normal.        Judgment: Judgment normal.     ED Results / Procedures / Treatments   Labs (all labs ordered are listed, but only abnormal results are displayed) Labs Reviewed  BASIC METABOLIC PANEL - Abnormal; Notable for the following components:      Result Value   Potassium 2.9 (*)    Chloride 97 (*)    Glucose, Bld 139 (*)    Calcium 8.6 (*)    All other components within normal limits  CBC - Abnormal; Notable for the following components:   WBC 3.6 (*)    All other components within normal limits  URINALYSIS, ROUTINE W REFLEX MICROSCOPIC  CBG MONITORING, ED    EKG EKG Interpretation  Date/Time:  Wednesday March 14 2020 12:12:34 EDT Ventricular Rate:  67 PR Interval:  106 QRS Duration: 92 QT Interval:  396 QTC Calculation: 418 R Axis:   29 Text Interpretation: Sinus rhythm with short PR Otherwise normal ECG since last tracing no significant change Confirmed by Mancel Bale [phone]) on 03/14/2020 2:25:45 PM   Radiology No results found.  Procedures Procedures (including critical care time)  Medications Ordered in ED Medications  sodium chloride 0.9 % bolus 1,000 mL (1,000 mLs Intravenous New Bag/Given 03/14/20 1459)  sodium chloride 0.9 % bolus 1,000 mL (0 mLs Intravenous Stopped 03/14/20 1605)  potassium chloride SA (KLOR-CON) CR tablet 40 mEq (40 mEq Oral Given 03/14/20 1609)    ED Course  I have reviewed the triage vital signs and the nursing  notes.  Pertinent labs & imaging results that were available during my care of the patient were reviewed by me and considered in my medical decision making (see chart for details).  Clinical Course as of Mar 14 1641  Wed Mar 14, 2020  1602 Normal except potassium low, chloride low, glucose high, calcium low  Basic metabolic panel(!) [EW]  1602  Normal except white count low  CBC(!) [EW]    Clinical Course User Index [EW] Mancel Bale, MD   MDM Rules/Calculators/A&P                       Patient Vitals for the past 24 hrs:  BP Temp Temp src Pulse Resp SpO2 Height Weight  03/14/20 1600 (!) 91/44 -- -- 79 20 97 % -- --  03/14/20 1545 (!) 102/50 -- -- 76 (!) 23 97 % -- --  03/14/20 1530 (!) 107/56 -- -- 76 18 97 % -- --  03/14/20 1515 (!) 106/53 -- -- 74 (!) 22 95 % -- --  03/14/20 1500 110/64 -- -- 74 (!) 22 97 % -- --  03/14/20 1445 (!) 104/58 -- -- 72 17 94 % -- --  03/14/20 1430 106/87 -- -- 73 (!) 24 97 % -- --  03/14/20 1424 112/62 -- -- 78 15 95 % -- --  03/14/20 1240 -- -- -- -- -- -- 5\' 3"  (1.6 m) 68 kg  03/14/20 1212 (!) 107/58 -- -- 81 14 96 % 5\' 3"  (1.6 m) 68 kg  03/14/20 1206 (!) 67/53 98 F (36.7 C) Oral 86 20 98 % -- --    4:42 PM Reevaluation with update and discussion. After initial assessment and treatment, an updated evaluation reveals she remains comfortable, has tolerated food and fluids orally.  Repeat vital signs, while standing show improvement of orthostasis.   Medical Decision Making: Nonspecific weakness and malaise, with orthostatic hypotension, likely related to poor nutrition/poor intake.  Incidental hypokalemia, without clear cause such as vomiting or diuretic medication use.  I suspect this is related to the poor nutrition as well.  Patient does not appear overtly depressed.  No abdominal pain.  Unclear cause of decreased oral intake.  No evidence for hypoxia, which was found by PCP evaluation.  Blood pressure improved after initial  treatment however she did not immediately urinate.  JANAUTICA NETZLEY was evaluated in Emergency Department on 03/14/2020 for the symptoms described in the history of present illness. She was evaluated in the context of the global COVID-19 pandemic, which necessitated consideration that the patient might be at risk for infection with the SARS-CoV-2 virus that causes COVID-19. Institutional protocols and algorithms that pertain to the evaluation of patients at risk for COVID-19 are in a state of rapid change based on information released by regulatory bodies including the CDC and federal and state organizations. These policies and algorithms were followed during the patient's care in the ED.   CRITICAL CARE-no Performed by: Benay Pillow   Nursing Notes Reviewed/ Care Coordinated Applicable Imaging Reviewed Interpretation of Laboratory Data incorporated into ED treatment  The patient appears reasonably screened and/or stabilized for discharge and I doubt any other medical condition or other [facility] requiring further screening, evaluation, or treatment in the ED at this time prior to discharge.  Plan: Home Medications-continue usual; Home Treatments-increased fluid, fluid and potassium in diet; return here if the recommended treatment, does not improve the symptoms; Recommended follow up-PCP, as needed, and checkup appointment in 1 week for repeat potassium testing     Final Clinical Impression(s) / ED Diagnoses Final diagnoses:  Orthostatic hypotension  Hypokalemia  Malaise    Rx / DC Orders ED Discharge Orders    None       Mancel Bale, MD 03/14/20 1643

## 2020-03-14 NOTE — ED Triage Notes (Signed)
Pt arrives POV for eval of severe fatigue. Pt reports she went to PCP this AM for severe fatigue and was found to be orthostatic to 60 systolic, hypoxia while ambulating at PCP. Denies known pain, injury, illness. On arrival here, BP in 60s, improved to 107 after sitting for a while. Instructed not to stand up while waiting for room. No other acute complaints

## 2020-03-14 NOTE — Discharge Instructions (Addendum)
The testing today did not show any serious problems, after we gave you IV fluids to treat your low blood pressure while standing.  It is important to drink plenty of fluids, eat 3 meals a day, and concentrate on getting more potassium in your diet.  At this point there is no requirement for hospitalization.  Please see your doctor for further care and treatment, with an appointment in a week or so, sooner if needed.

## 2020-03-14 NOTE — Patient Instructions (Signed)
I recommend that you go to the [facility] emergency department right away.   Tell them you saw me and that you are orthostatic in our office and that your oxygen level dropped with walking.   I do not have an answer for your severe fatigue and you will need an in depth work up to rule out any underlying serious illness.

## 2020-03-14 NOTE — Addendum Note (Signed)
Addended by: Herminio Commons A on: 03/14/2020 11:55 AM   Modules accepted: Orders

## 2020-03-14 NOTE — ED Notes (Signed)
While standing with Pt to obtain her Orthostatic Vitals Pt had a hard time standing for more than 2 minutes without feeling weak and needing to sit down.

## 2020-03-14 NOTE — ED Notes (Signed)
Pt given food and water

## 2020-03-19 ENCOUNTER — Other Ambulatory Visit: Payer: Self-pay

## 2020-03-19 ENCOUNTER — Emergency Department (HOSPITAL_COMMUNITY)
Admission: EM | Admit: 2020-03-19 | Discharge: 2020-03-19 | Disposition: A | Discharge status: Home / Self Care | Payer: Medicare HMO | Attending: Emergency Medicine | Admitting: Emergency Medicine

## 2020-03-19 ENCOUNTER — Encounter (HOSPITAL_COMMUNITY): Payer: Self-pay

## 2020-03-19 ENCOUNTER — Emergency Department (HOSPITAL_COMMUNITY)
Admission: EM | Admit: 2020-03-19 | Discharge: 2020-03-19 | Disposition: A | Discharge status: Home / Self Care | Payer: Self-pay

## 2020-03-19 DIAGNOSIS — R5383 Other fatigue: Secondary | ICD-10-CM | POA: Diagnosis not present

## 2020-03-19 DIAGNOSIS — E876 Hypokalemia: Secondary | ICD-10-CM | POA: Insufficient documentation

## 2020-03-19 MED ORDER — POTASSIUM CHLORIDE CRYS ER 20 MEQ PO TBCR: 20.0000 meq | EXTENDED_RELEASE_TABLET | Freq: Two times a day (BID) | ORAL | 0 refills | Status: DC

## 2020-03-19 NOTE — ED Provider Notes (Signed)
AP-EMERGENCY DEPT [facility] Emergency Department Provider Note MRN:  [id number]  Arrival date & time: 03/19/20     Chief Complaint   Fatigue   History of Present Illness   Tammy [name] is a 69 y.o. year-old female with a history of GERD presenting to the ED with chief complaint of fatigue.  Patient is experiencing very low energy and easy fatigability for the past 1 or 2 weeks.  Does not get short of breath or dyspneic on exertion, but when she walks around she simply becomes very tired.  Denies any pain, no fever, no cough, no bleeding, no black stool, no other complaints.  Had lab testing done few days ago but has not heard the results.  Review of Systems  A complete 10 system review of systems was obtained and all systems are negative except as noted in the HPI and PMH.   Patient's Health History    Past Medical History:  Diagnosis Date  . Depression   . GAD (generalized anxiety disorder)   . GERD (gastroesophageal reflux disease)   . Prediabetes     Past Surgical History:  Procedure Laterality Date  . CESAREAN SECTION      No family history on file.  Social History   Socioeconomic History  . Marital status: Widowed    Spouse name: Not on file  . Number of children: Not on file  . Years of education: Not on file  . Highest education level: Not on file  Occupational History  . Not on file  Tobacco Use  . Smoking status: Never Smoker  . Smokeless tobacco: Never Used  Substance and Sexual Activity  . Alcohol use: No  . Drug use: No  . Sexual activity: Not Currently  Other Topics Concern  . Not on file  Social History Narrative  . Not on file   Social Determinants of Health   Financial Resource Strain:   . Difficulty of Paying Living Expenses:   Food Insecurity:   . Worried About Programme researcher, broadcasting/film/video in the Last Year:   . Barista in the Last Year:   Transportation Needs:   . Freight forwarder (Medical):   Marland Kitchen Lack of Transportation  (Non-Medical):   Physical Activity:   . Days of Exercise per Week:   . Minutes of Exercise per Session:   Stress:   . Feeling of Stress :   Social Connections:   . Frequency of Communication with Friends and Family:   . Frequency of Social Gatherings with Friends and Family:   . Attends Religious Services:   . Active Member of Clubs or Organizations:   . Attends Banker Meetings:   Marland Kitchen Marital Status:   Intimate Partner Violence:   . Fear of Current or Ex-Partner:   . Emotionally Abused:   Marland Kitchen Physically Abused:   . Sexually Abused:      Physical Exam   Vitals:   03/19/20 1055  BP: (!) 129/104  Pulse: 80  Resp: 14  Temp: 97.9 F (36.6 C)  SpO2: 100%    CONSTITUTIONAL: Well-appearing, NAD NEURO:  Alert and oriented x 3, no focal deficits EYES:  eyes equal and reactive ENT/NECK:  no LAD, no JVD CARDIO: Regular rate, well-perfused, normal S1 and S2 PULM:  CTAB no wheezing or rhonchi GI/GU:  normal bowel sounds, non-distended, non-tender MSK/SPINE:  No gross deformities, no edema SKIN:  no rash, atraumatic PSYCH:  Appropriate speech and behavior  *Additional and/or pertinent  findings included in MDM below  Diagnostic and Interventional Summary    EKG Interpretation  Date/Time:    Ventricular Rate:    PR Interval:    QRS Duration:   QT Interval:    QTC Calculation:   R Axis:     Text Interpretation:        Labs Reviewed - No data to display  No orders to display    Medications - No data to display   Procedures  /  Critical Care Procedures  ED Course and Medical Decision Making  I have reviewed the triage vital signs, the nursing notes, and pertinent available records from the EMR.  Pertinent labs & imaging results that were available during my care of the patient were reviewed by me and considered in my medical decision making (see below for details).     Symptoms can be explained by hypokalemia which was seen on her labs a few days ago.   She is hemodynamically stable with completely normal vital signs, looks well, currently without complaints.  I see no indication for repeat testing, I feel that this hypokalemia needs to be treated and then she can be reassessed by her primary care doctor.    Barth Kirks. Sedonia Small, MD [place] [email or web address]  Final Clinical Impressions(s) / ED Diagnoses     ICD-10-CM   1. Fatigue, unspecified type  R53.83   2. Hypokalemia  E87.6     ED Discharge Orders         Ordered    potassium chloride SA (KLOR-CON) 20 MEQ tablet  2 times daily     03/19/20 1145           Discharge Instructions Discussed with and Provided to Patient:     Discharge Instructions     You were evaluated in the Emergency Department and after careful evaluation, we did not find any emergent condition requiring admission or further testing in the hospital.  Your exam/testing today was overall reassuring.  Your symptoms seem to be explained by low potassium levels.  Please take the potassium pills as prescribed for the next week and increase your dietary intake of potassium.  Please return to the Emergency Department if you experience any worsening of your condition.  We encourage you to follow up with a primary care provider.  Thank you for allowing [place] to be a part of your care.       Maudie Flakes, MD 03/19/20 1147

## 2020-03-19 NOTE — ED Triage Notes (Signed)
Pt has been very fatigued since March the 25. Was seen at [facility] on the 31st. Denies any NVD. Pt states she is just tired all the time. No other complaints. Denies SOB.

## 2020-03-19 NOTE — Discharge Instructions (Addendum)
You were evaluated in the Emergency Department and after careful evaluation, we did not find any emergent condition requiring admission or further testing in the hospital.  Your exam/testing today was overall reassuring.  Your symptoms seem to be explained by low potassium levels.  Please take the potassium pills as prescribed for the next week and increase your dietary intake of potassium.  Please return to the Emergency Department if you experience any worsening of your condition.  We encourage you to follow up with a primary care provider.  Thank you for allowing [place] to be a part of your care.

## 2020-03-22 ENCOUNTER — Other Ambulatory Visit: Payer: Self-pay | Admitting: Family Medicine

## 2020-03-28 ENCOUNTER — Telehealth: Payer: Self-pay | Admitting: Family Medicine

## 2020-03-28 NOTE — Telephone Encounter (Signed)
Done pt is doing virtual in the morning

## 2020-03-28 NOTE — Telephone Encounter (Signed)
Let's schedule a virtual for tomorrow please.

## 2020-03-28 NOTE — Telephone Encounter (Signed)
Pt called and is wanting to know if she needs to come in for a follow up from when she was in the hosp pt states [facility] and had a covid test on either April 4th or the 11th she can remember and it was positive, but states she is not having any of symptom now pt can be reached at [phone]

## 2020-03-29 ENCOUNTER — Other Ambulatory Visit: Payer: Self-pay

## 2020-03-29 ENCOUNTER — Encounter: Payer: Self-pay | Admitting: Family Medicine

## 2020-03-29 ENCOUNTER — Ambulatory Visit: Payer: Medicare HMO | Admitting: Family Medicine

## 2020-03-29 NOTE — Progress Notes (Signed)
   Subjective:  Documentation for virtual audio and video telecommunications through [employer] encounter:  The patient was located at home. 2 patient identifiers used.  The provider was located in the office. The patient did consent to this visit and is aware of possible charges through their insurance for this visit.  The other persons participating in this telemedicine service were none.    Patient ID: Tammy [name], female    DOB: September 01, 1951, 69 y.o.   MRN: [id number]  HPI Chief Complaint  Patient presents with  . hospital follow-up    hospital follow-up April 4th, exahusted, weakness- feeling alot better.    She was not seen. I attempted to contact her multiple times to do her virtual visit.   Review of Systems Pertinent positives and negatives in the history of present illness.     Objective:   Physical Exam Wt 156 lb (70.8 kg)   BMI 27.63 kg/m         Assessment & Plan:  Erroneous encounter - disregard

## 2020-03-30 ENCOUNTER — Encounter: Payer: Self-pay | Admitting: Family Medicine

## 2020-03-30 ENCOUNTER — Telehealth (INDEPENDENT_AMBULATORY_CARE_PROVIDER_SITE_OTHER): Payer: Medicare HMO | Admitting: Family Medicine

## 2020-03-30 VITALS — Wt 156.0 lb

## 2020-03-30 DIAGNOSIS — R5383 Other fatigue: Secondary | ICD-10-CM | POA: Diagnosis not present

## 2020-03-30 DIAGNOSIS — Z8616 Personal history of COVID-19: Secondary | ICD-10-CM | POA: Diagnosis not present

## 2020-03-30 DIAGNOSIS — U071 COVID-19: Secondary | ICD-10-CM

## 2020-03-30 DIAGNOSIS — E876 Hypokalemia: Secondary | ICD-10-CM | POA: Diagnosis not present

## 2020-03-30 HISTORY — DX: COVID-19: U07.1

## 2020-03-30 NOTE — Progress Notes (Signed)
   Subjective:  Documentation for virtual audio and video telecommunications through [employer]   The patient was located at home. 2 patient identifiers used.  The provider was located in the office. The patient did consent to this visit and is aware of possible charges through their insurance for this visit.  The other persons participating in this telemedicine service were none.    Patient ID: Tammy [name], female    DOB: January 09, 1951, 69 y.o.   MRN: [id number]  HPI Chief Complaint  Patient presents with  . follow-up    follow-up on covid. doing much better, no symptoms tested april 4th   This is a follow up on having a positive Covid-19 infection on 03/18/2020 Symptoms started on 03/08/2020. She only had fatigue and a loss of taste but only for a short time. States she feels back to her baseline now.   Questions whether she needs to continue on potassium supplement. She was given 7 days in the ED for her low potassium. Eating her usual diet now.   Denies fever, chills, dizziness, chest pain, palpitations, shortness of breath, abdominal pain, N/V/D, urinary symptoms, LE edema.   Reviewed allergies, medications, past medical, surgical, family, and social history.   Review of Systems Pertinent positives and negatives in the history of present illness.     Objective:   Physical Exam Wt 156 lb (70.8 kg)   BMI 27.63 kg/m   Alert and oriented and in no acute distress. Respirations unlabored. Normal speech, mood.       Assessment & Plan:  COVID-19 virus infection  Fatigue, unspecified type  Hypokalemia  She appears to have recovered. No longer symptomatic. Review of her records from the ED she had low potassium. Most likely this was due to her poor appetite while she was sick. Now eating normal again. She took K-Dur for 7 days and now is out. She will follow up with me next week and we will check labs.   Time spent on call was 12 minutes and in review of previous records  15 minutes total.  This virtual service is not related to other E/M service within previous 7 days.

## 2020-04-02 ENCOUNTER — Telehealth: Payer: Self-pay | Admitting: Family Medicine

## 2020-04-02 DIAGNOSIS — E876 Hypokalemia: Secondary | ICD-10-CM

## 2020-04-02 NOTE — Telephone Encounter (Signed)
Pt called to schedule a lab visit. I do not see any orders. Pt states she had a virtual visit and was told to come in for labs. Please advise pt at [phone] and add orders if appropriate.

## 2020-04-02 NOTE — Telephone Encounter (Signed)
Put orders in and left message for pt to call back to schedule nurse visit

## 2020-04-02 NOTE — Telephone Encounter (Signed)
She needs a BMP for hypokalemia. Thanks.

## 2020-04-11 ENCOUNTER — Other Ambulatory Visit: Payer: Self-pay

## 2020-04-11 ENCOUNTER — Encounter: Payer: Self-pay | Admitting: Family Medicine

## 2020-04-11 ENCOUNTER — Ambulatory Visit (INDEPENDENT_AMBULATORY_CARE_PROVIDER_SITE_OTHER): Payer: Medicare HMO | Admitting: Family Medicine

## 2020-04-11 VITALS — BP 110/70 | HR 76 | Temp 97.8°F | Wt 152.0 lb

## 2020-04-11 DIAGNOSIS — R3915 Urgency of urination: Secondary | ICD-10-CM

## 2020-04-11 DIAGNOSIS — R350 Frequency of micturition: Secondary | ICD-10-CM | POA: Diagnosis not present

## 2020-04-11 DIAGNOSIS — E876 Hypokalemia: Secondary | ICD-10-CM | POA: Diagnosis not present

## 2020-04-11 LAB — POCT URINALYSIS DIP (PROADVANTAGE DEVICE)
Bilirubin, UA: NEGATIVE
Blood, UA: NEGATIVE
Glucose, UA: NEGATIVE mg/dL
Ketones, POC UA: NEGATIVE mg/dL
Leukocytes, UA: NEGATIVE
Nitrite, UA: NEGATIVE
Protein Ur, POC: NEGATIVE mg/dL
Specific Gravity, Urine: 1.02
Urobilinogen, Ur: NEGATIVE
pH, UA: 6.5 (ref 5.0–8.0)

## 2020-04-11 MED ORDER — SULFAMETHOXAZOLE-TRIMETHOPRIM 800-160 MG PO TABS: 1.0000 | ORAL_TABLET | Freq: Two times a day (BID) | ORAL | 0 refills | Status: DC

## 2020-04-11 NOTE — Progress Notes (Signed)
Subjective:  Tammy [name] is a 69 y.o. female who complains of possible urinary tract infection.  She has had symptoms for 4 days.  Symptoms include urinary frequency, urgency, left flank pain. Patient denies fever, chills, chest pain, palpitations, shortness of breath, abdominal pain, N/V/D. Marland Kitchen  Last UTI was years ago.   Using cranberry pills for current symptoms.    Patient does not have a history of recurrent UTI. Patient does not have a history of pyelonephritis.  No other aggravating or relieving factors.  No other c/o.  Past Medical History:  Diagnosis Date  . Depression   . GAD (generalized anxiety disorder)   . GERD (gastroesophageal reflux disease)   . Prediabetes     She had low potassium in the ED. She was given 7 days of a potassium supplement and completed this.   States she is having some shaky hands recently.   ROS as in subjective  Reviewed allergies, medications, past medical, surgical, and social history.    Objective: Vitals:   04/11/20 1443  BP: 110/70  Pulse: 76  Temp: 97.8 F (36.6 C)    General appearance: alert, no distress, WD/WN, female Abdomen: +bs, soft, non tender, non distended, no masses, no hepatomegaly, no splenomegaly, no bruits Back: no CVA tenderness GU: deferred      Laboratory:  Urine dipstick: negative for all components.       Assessment: Urinary urgency - Plan: CBC with Differential/Platelet, Basic metabolic panel, Urine Culture, sulfamethoxazole-trimethoprim (BACTRIM DS) 800-160 MG tablet  Urine frequency - Plan: POCT Urinalysis DIP (Proadvantage Device), CBC with Differential/Platelet, Urine Culture, sulfamethoxazole-trimethoprim (BACTRIM DS) 800-160 MG tablet  Hypokalemia - Plan: Basic metabolic panel    Plan: Discussed symptoms, diagnosis, possible complications, and usual course of illness.  I will treat her based on clinical diagnosis and not on the UA dipstick. Begin Bactrim.  Advised increased water intake, can use  OTC Tylenol for pain.       Urine culture sent.   I will check labs due to recent Covid infection and hypokalemia.  Keep an eye on shakiness and look for triggers.   Call or return if worse or not improving.

## 2020-04-12 LAB — CBC WITH DIFFERENTIAL/PLATELET
Basophils Absolute: 0.1 10*3/uL (ref 0.0–0.2)
Basos: 1 %
EOS (ABSOLUTE): 0.2 10*3/uL (ref 0.0–0.4)
Eos: 3 %
Hematocrit: 37.8 % (ref 34.0–46.6)
Hemoglobin: 12.5 g/dL (ref 11.1–15.9)
Immature Grans (Abs): 0 10*3/uL (ref 0.0–0.1)
Immature Granulocytes: 0 %
Lymphocytes Absolute: 3.2 10*3/uL — ABNORMAL HIGH (ref 0.7–3.1)
Lymphs: 43 %
MCH: 31.3 pg (ref 26.6–33.0)
MCHC: 33.1 g/dL (ref 31.5–35.7)
MCV: 95 fL (ref 79–97)
Monocytes Absolute: 0.7 10*3/uL (ref 0.1–0.9)
Monocytes: 10 %
Neutrophils Absolute: 3.1 10*3/uL (ref 1.4–7.0)
Neutrophils: 43 %
Platelets: 360 10*3/uL (ref 150–450)
RBC: 3.99 x10E6/uL (ref 3.77–5.28)
RDW: 13.1 % (ref 11.7–15.4)
WBC: 7.3 10*3/uL (ref 3.4–10.8)

## 2020-04-12 LAB — BASIC METABOLIC PANEL
BUN/Creatinine Ratio: 27 (ref 12–28)
BUN: 22 mg/dL (ref 8–27)
CO2: 25 mmol/L (ref 20–29)
Calcium: 9.4 mg/dL (ref 8.7–10.3)
Chloride: 104 mmol/L (ref 96–106)
Creatinine, Ser: 0.83 mg/dL (ref 0.57–1.00)
GFR calc Af Amer: 84 mL/min/{1.73_m2} (ref >59)
GFR calc non Af Amer: 73 mL/min/{1.73_m2} (ref >59)
Glucose: 87 mg/dL (ref 65–99)
Potassium: 4.2 mmol/L (ref 3.5–5.2)
Sodium: 141 mmol/L (ref 134–144)

## 2020-04-12 LAB — URINE CULTURE: Organism ID, Bacteria: NO GROWTH

## 2020-04-12 NOTE — Progress Notes (Signed)
Her labs are back to normal.

## 2020-04-13 NOTE — Progress Notes (Signed)
Her urine culture is negative for an infection. She can stop the antibiotic and let me know if her symptoms are not clearing up soon.

## 2020-04-22 ENCOUNTER — Other Ambulatory Visit: Payer: Self-pay | Admitting: Family Medicine

## 2020-05-02 ENCOUNTER — Ambulatory Visit
Admission: RE | Admit: 2020-05-02 | Discharge: 2020-05-02 | Disposition: A | Discharge status: Home / Self Care | Payer: Medicare HMO | Source: Ambulatory Visit | Attending: Family Medicine | Admitting: Family Medicine

## 2020-05-02 ENCOUNTER — Other Ambulatory Visit: Payer: Self-pay

## 2020-05-02 DIAGNOSIS — Z1231 Encounter for screening mammogram for malignant neoplasm of breast: Secondary | ICD-10-CM

## 2020-06-11 ENCOUNTER — Encounter: Payer: Self-pay | Admitting: Family Medicine

## 2020-06-11 ENCOUNTER — Ambulatory Visit (INDEPENDENT_AMBULATORY_CARE_PROVIDER_SITE_OTHER): Payer: Medicare HMO | Admitting: Family Medicine

## 2020-06-11 ENCOUNTER — Other Ambulatory Visit: Payer: Self-pay

## 2020-06-11 VITALS — BP 110/60 | HR 85 | Ht 63.5 in | Wt 157.2 lb

## 2020-06-11 DIAGNOSIS — Z7189 Other specified counseling: Secondary | ICD-10-CM

## 2020-06-11 DIAGNOSIS — R7303 Prediabetes: Secondary | ICD-10-CM

## 2020-06-11 DIAGNOSIS — Z8616 Personal history of COVID-19: Secondary | ICD-10-CM | POA: Insufficient documentation

## 2020-06-11 DIAGNOSIS — Z0000 Encounter for general adult medical examination without abnormal findings: Secondary | ICD-10-CM | POA: Diagnosis not present

## 2020-06-11 DIAGNOSIS — Z634 Disappearance and death of family member: Secondary | ICD-10-CM

## 2020-06-11 DIAGNOSIS — E559 Vitamin D deficiency, unspecified: Secondary | ICD-10-CM | POA: Diagnosis not present

## 2020-06-11 DIAGNOSIS — E7800 Pure hypercholesterolemia, unspecified: Secondary | ICD-10-CM

## 2020-06-11 DIAGNOSIS — F411 Generalized anxiety disorder: Secondary | ICD-10-CM | POA: Diagnosis not present

## 2020-06-11 DIAGNOSIS — L659 Nonscarring hair loss, unspecified: Secondary | ICD-10-CM

## 2020-06-11 DIAGNOSIS — Z7185 Encounter for immunization safety counseling: Secondary | ICD-10-CM

## 2020-06-11 HISTORY — DX: Personal history of COVID-19: Z86.16

## 2020-06-11 MED ORDER — ALPRAZOLAM 0.5 MG PO TABS: ORAL_TABLET | ORAL | 0 refills | Status: DC

## 2020-06-11 NOTE — Patient Instructions (Signed)
°  Tammy [name] , Thank you for taking time to come for your Medicare Wellness Visit. I appreciate your ongoing commitment to your health goals. Please review the following plan we discussed and let me know if I can assist you in the future.   These are the goals we discussed:  Continue on your current medications.   Call and schedule with a grief counselor through Hospice as discussed.   You are actually not due for your colonoscopy until 2024.   I recommend that you get your Covid vaccine at your local pharmacy. DO NOT GET ANY OTHER VACCINES WITHIN 2 WEEKS OF THIS!  I also recommend that you get your Tdap (Tetanus, Diptheria, and Pertussis) vaccine but not within 2 weeks of your Covid vaccine.   You may return here for your pneumonia vaccine at your convenience or we can give this to you at your next visit.      This is a list of the screening recommended for you and due dates:  Health Maintenance  Topic Date Due   COVID-19 Vaccine (1) Never done   Tetanus Vaccine  Never done   Colon Cancer Screening  Never done   Pneumonia vaccines (2 of 2 - PCV13) 06/01/2020   Flu Shot  07/15/2020   Mammogram  05/02/2022   DEXA scan (bone density measurement)  Completed    Hepatitis C: One time screening is recommended by Center for Disease Control  (CDC) for  adults born from [birth year] through 1964.   Completed

## 2020-06-11 NOTE — Progress Notes (Signed)
Tammy [name] is a 69 y.o. female who presents for annual wellness visit, CPE and follow-up on chronic medical conditions.  She has the following concerns:  States she has noticed her hair falling out more than usual when she brushes.   Uses Treseme shampoo but just started.   Increased stress. States her brother died on 06-08-20.  Taking alprazolam.   Her son's wife left him and her grandchildren. 3 kids and she is now helping care for them.  Taking Lexapro.   GERD- taking omeprazole prn  No longer taking potassium.   Valacyclovir for cold sores prn   Prediabetes- last A1c 5.9% on 03/14/2020   Immunization History  Administered Date(s) Administered  . Fluad Quad(high Dose 65+) 09/29/2019  . Pneumococcal Polysaccharide-23 06/02/2019   Last Pap smear: 2016 , declines pelvic exam Last mammogram: 5/21 Last colonoscopy: 7 years ago and due for recall in 2024 Last DEXA: 2016 and normal  Dentist: used to but Dr. retired Ophtho: [facility] in [place] Exercise: walks daily   Other doctors caring for patient include: Dr. Loreta Ave- GI    Depression screen:  See questionnaire below.  Depression screen [facility] 2/9 06/11/2020 06/02/2019 03/23/2019 05/05/2018  Decreased Interest 0 0 0 0  Down, Depressed, Hopeless 0 0 0 0  PHQ - 2 Score 0 0 0 0    Fall Risk Screen: see questionnaire below. Fall Risk  06/11/2020 06/02/2019 05/05/2018  Falls in the past year? 0 0 Yes  Number falls in past yr: 0 0 1  Injury with Fall? 0 0 Yes    ADL screen:  See questionnaire below Functional Status Survey: Is the patient deaf or have difficulty hearing?: No Does the patient have difficulty seeing, even when wearing glasses/contacts?: No Does the patient have difficulty concentrating, remembering, or making decisions?: No Does the patient have difficulty walking or climbing stairs?: No Does the patient have difficulty dressing or bathing?: No Does the patient have difficulty doing errands alone such as  visiting a doctor's office or shopping?: No   End of Life Discussion:  Patient does not have a living will and medical power of attorney  Review of Systems Constitutional: -fever, -chills, -sweats, -unexpected weight change, -anorexia, -fatigue Allergy: -sneezing, -itching, -congestion Dermatology: denies changing moles, rash, lumps, new worrisome lesions ENT: -runny nose, -ear pain, -sore throat, -hoarseness, -sinus pain, -teeth pain, -tinnitus, -hearing loss, -epistaxis Cardiology:  -chest pain, -palpitations, -edema, -orthopnea, -paroxysmal nocturnal dyspnea Respiratory: -cough, -shortness of breath, -dyspnea on exertion, -wheezing, -hemoptysis Gastroenterology: -abdominal pain, -nausea, -vomiting, -diarrhea, -constipation, -blood in stool, -changes in bowel movement, -dysphagia Hematology: -bleeding or bruising problems Musculoskeletal: -arthralgias, -myalgias, -joint swelling, -back pain, -neck pain, -cramping, -gait changes Ophthalmology: -vision changes, -eye redness, -itching, -discharge Urology: -dysuria, -difficulty urinating, -hematuria, -urinary frequency, -urgency, =incontinence Neurology: -headache, -weakness, -tingling, -numbness, -speech abnormality, -memory loss, -falls, -dizziness Psychology:  +depressed mood, -agitation, -sleep problems    PHYSICAL EXAM:  BP 110/60   Pulse 85   Ht 5' 3.5" (1.613 m)   Wt 157 lb 3.2 oz (71.3 kg)   BMI 27.41 kg/m   General Appearance: Alert, cooperative, no distress, appears stated age Head: Normocephalic, without obvious abnormality, atraumatic Eyes: PERRL, conjunctiva/corneas clear, EOM's intact Ears: Normal TM's and external ear canals Nose: mask on  Throat: mask on  Neck: Supple, no lymphadenopathy; thyroid: no enlargement/tenderness/nodules; no JVD Back: Spine nontender, no curvature, ROM normal, no CVA tenderness Lungs: Clear to auscultation bilaterally without wheezes, rales or ronchi; respirations unlabored Chest Wall:  No tenderness or deformity Heart: Regular rate and rhythm, S1 and S2 normal, no murmur, rub or gallop Breast Exam: declines  Abdomen: Soft, non-tender, nondistended, normoactive bowel sounds, no masses, no hepatosplenomegaly Genitalia: declines  Extremities: No clubbing, cyanosis or edema Pulses: 2+ and symmetric all extremities Skin: Skin color, texture, turgor normal, no rashes or lesions Lymph nodes: Cervical, supraclavicular, and axillary nodes normal Neurologic: CNII-XII intact, normal strength, sensation and gait; reflexes 2+ and symmetric throughout Psych: Normal mood, fairly flat affect, hygiene and grooming.  ASSESSMENT/PLAN: Medicare annual wellness visit, subsequent -She is here today for a Medicare wellness visit.  Reports being in her usual state of health but she does have some thinning of her hair.  She has only noticed this for couple of weeks.  Denies falls, difficulties with ADLs or memory concerns.  She reports her mood is good considering recent death of family member and having to help her son take care of her grandchildren due to her daughter-in-law moving out.  Routine general medical examination at a health care facility - Plan: CBC with Differential/Platelet, Comprehensive metabolic panel, Lipid panel -Preventive health care reviewed.  She is up-to-date on colonoscopy, mammogram and bone density.  Last pelvic exam and Pap smear in 2016 and normal.  She declines this today.  Immunizations reviewed.  Counseling on healthy lifestyle including diet and exercise.  GAD (generalized anxiety disorder) - Plan: ALPRAZolam (XANAX) 0.5 MG tablet -Overall she has been slightly more anxious and has had increased stress.  She will take her Xanax sparingly and seek grief counseling.continue Lexapro   Prediabetes -Previous A1c 5.9% and this was in the past 3 months.  We will recheck her A1c at her next visit.  Counseling on healthy diet and exercise.  Vitamin D deficiency - Plan:  VITAMIN D 25 Hydroxy (Vit-D Deficiency, Fractures) -She is currently taking a multivitamin and will continue.  Check vitamin D level and adjust dose as appropriate  Death of family member - Plan: ALPRAZolam (XANAX) 0.5 MG tablet -Encouraged her to call in schedule with grief counseling through hospice.  Her brother was in hospice when he passed.  She has been taking her Xanax more often and I will refill this today.  Hair thinning - Plan: TSH, T4, free, T3 -2-week history of her hair thinning.  Discussed possible etiologies.  Follow-up pending lab results.  History of COVID-19 -States she fully recovered.  Immunization counseling -Recommend that she get the Covid vaccine as well as Tdap at her pharmacy.  Advised her to avoid getting any vaccines within 2 weeks of her Covid vaccines.  She may return here for her pneumonia vaccine at her convenience.  She declines this today.  Advance directive discussed with patient -Once again this year, we discussed advanced directives.  She does have the paperwork at home.  Declines to discuss this further today.  She will follow-up with questions.  Elevated LDL cholesterol level - Plan: Lipid panel -Counseling on healthy diet and exercise for management of cholesterol.  Follow-up pending lipid panel results.  Last year her ASCVD 10-year risk was 6.5%.     Discussed monthly self breast exams and yearly mammograms; at least 30 minutes of aerobic activity at least 5 days/week and weight-bearing exercise 2x/week; proper sunscreen use reviewed; healthy diet, including goals of calcium and vitamin D intake and alcohol recommendations (less than or equal to 1 drink/day) reviewed; regular seatbelt use; changing batteries in smoke detectors.  Immunization recommendations discussed.  Colonoscopy recommendations reviewed  Medicare Attestation I have personally reviewed: The patient's medical and social history Their use of alcohol, tobacco or illicit  drugs Their current medications and supplements The patient's functional ability including ADLs,fall risks, home safety risks, cognitive, and hearing and visual impairment Diet and physical activities Evidence for depression or mood disorders  The patient's weight, height, and BMI have been recorded in the chart.  I have made referrals, counseling, and provided education to the patient based on review of the above and I have provided the patient with a written personalized care plan for preventive services.     Hetty Blend, NP-C   06/11/2020

## 2020-06-12 LAB — LIPID PANEL
Chol/HDL Ratio: 6.1 {ratio} — ABNORMAL HIGH (ref 0.0–4.4)
Cholesterol, Total: 252 mg/dL — ABNORMAL HIGH (ref 100–199)
HDL: 41 mg/dL (ref >39)
LDL Chol Calc (NIH): 158 mg/dL — ABNORMAL HIGH (ref 0–99)
Triglycerides: 282 mg/dL — ABNORMAL HIGH (ref 0–149)
VLDL Cholesterol Cal: 53 mg/dL — ABNORMAL HIGH (ref 5–40)

## 2020-06-12 LAB — COMPREHENSIVE METABOLIC PANEL
ALT: 24 [IU]/L (ref 0–32)
AST: 22 [IU]/L (ref 0–40)
Albumin/Globulin Ratio: 1.6 (ref 1.2–2.2)
Albumin: 4.2 g/dL (ref 3.8–4.8)
Alkaline Phosphatase: 81 [IU]/L (ref 48–121)
BUN/Creatinine Ratio: 23 (ref 12–28)
BUN: 18 mg/dL (ref 8–27)
Bilirubin Total: 0.3 mg/dL (ref 0.0–1.2)
CO2: 25 mmol/L (ref 20–29)
Calcium: 9.5 mg/dL (ref 8.7–10.3)
Chloride: 104 mmol/L (ref 96–106)
Creatinine, Ser: 0.8 mg/dL (ref 0.57–1.00)
GFR calc Af Amer: 88 mL/min/{1.73_m2} (ref >59)
GFR calc non Af Amer: 77 mL/min/{1.73_m2} (ref >59)
Globulin, Total: 2.6 g/dL (ref 1.5–4.5)
Glucose: 125 mg/dL — ABNORMAL HIGH (ref 65–99)
Potassium: 4.7 mmol/L (ref 3.5–5.2)
Sodium: 141 mmol/L (ref 134–144)
Total Protein: 6.8 g/dL (ref 6.0–8.5)

## 2020-06-12 LAB — CBC WITH DIFFERENTIAL/PLATELET
Basophils Absolute: 0.1 10*3/uL (ref 0.0–0.2)
Basos: 1 %
EOS (ABSOLUTE): 0.2 10*3/uL (ref 0.0–0.4)
Eos: 2 %
Hematocrit: 38.2 % (ref 34.0–46.6)
Hemoglobin: 13 g/dL (ref 11.1–15.9)
Immature Grans (Abs): 0 10*3/uL (ref 0.0–0.1)
Immature Granulocytes: 0 %
Lymphocytes Absolute: 2.5 10*3/uL (ref 0.7–3.1)
Lymphs: 36 %
MCH: 32.2 pg (ref 26.6–33.0)
MCHC: 34 g/dL (ref 31.5–35.7)
MCV: 95 fL (ref 79–97)
Monocytes Absolute: 0.5 10*3/uL (ref 0.1–0.9)
Monocytes: 8 %
Neutrophils Absolute: 3.6 10*3/uL (ref 1.4–7.0)
Neutrophils: 53 %
Platelets: 414 10*3/uL (ref 150–450)
RBC: 4.04 x10E6/uL (ref 3.77–5.28)
RDW: 12.7 % (ref 11.7–15.4)
WBC: 6.8 10*3/uL (ref 3.4–10.8)

## 2020-06-12 LAB — T3: T3, Total: 106 ng/dL (ref 71–180)

## 2020-06-12 LAB — VITAMIN D 25 HYDROXY (VIT D DEFICIENCY, FRACTURES): Vit D, 25-Hydroxy: 22.7 ng/mL — ABNORMAL LOW (ref 30.0–100.0)

## 2020-06-12 LAB — T4, FREE: Free T4: 1.02 ng/dL (ref 0.82–1.77)

## 2020-06-12 LAB — TSH: TSH: 1.72 u[IU]/mL (ref 0.450–4.500)

## 2020-06-12 NOTE — Progress Notes (Signed)
Her vitamin D is still low. I recommend increasing her vitamin D dose by 1,000 IUs. So if she is already taking a multivitamin with 800 or 1,000 IUs in it then take an additional vitamin D3 with 1,000 IUs in it daily.  Also, her cholesterol and triglycerides are elevated still so I recommend cutting back on foods high in cholesterol and fat such as biscuits, fried foods, butter etc.

## 2020-06-20 ENCOUNTER — Encounter: Payer: Self-pay | Admitting: Internal Medicine

## 2020-06-22 ENCOUNTER — Encounter: Payer: Self-pay | Admitting: Family Medicine

## 2020-10-09 ENCOUNTER — Other Ambulatory Visit: Payer: Self-pay | Admitting: Family Medicine

## 2020-10-09 DIAGNOSIS — F32A Depression, unspecified: Secondary | ICD-10-CM

## 2020-10-09 DIAGNOSIS — F411 Generalized anxiety disorder: Secondary | ICD-10-CM

## 2020-10-16 ENCOUNTER — Encounter: Payer: Self-pay | Admitting: Family Medicine

## 2020-10-16 ENCOUNTER — Other Ambulatory Visit: Payer: Self-pay

## 2020-10-16 ENCOUNTER — Telehealth (INDEPENDENT_AMBULATORY_CARE_PROVIDER_SITE_OTHER): Payer: Medicare HMO | Admitting: Family Medicine

## 2020-10-16 ENCOUNTER — Other Ambulatory Visit (INDEPENDENT_AMBULATORY_CARE_PROVIDER_SITE_OTHER): Payer: Medicare HMO

## 2020-10-16 VITALS — Wt 160.0 lb

## 2020-10-16 DIAGNOSIS — R059 Cough, unspecified: Secondary | ICD-10-CM

## 2020-10-16 DIAGNOSIS — R519 Headache, unspecified: Secondary | ICD-10-CM | POA: Diagnosis not present

## 2020-10-16 LAB — POC COVID19 BINAXNOW: SARS Coronavirus 2 Ag: NEGATIVE

## 2020-10-16 MED ORDER — BENZONATATE 200 MG PO CAPS: 200.0000 mg | ORAL_CAPSULE | Freq: Two times a day (BID) | ORAL | 0 refills | Status: DC | PRN

## 2020-10-16 MED ORDER — OMEPRAZOLE 40 MG PO CPDR: DELAYED_RELEASE_CAPSULE | ORAL | 0 refills | Status: DC

## 2020-10-16 NOTE — Progress Notes (Signed)
   Subjective:  Documentation for virtual telephone only. She does not have video access.   The patient was located at home. 2 patient identifiers used.  The provider was located in the office. The patient did consent to this visit and is aware of possible charges through their insurance for this visit.  The other persons participating in this telemedicine service were none. Time spent on call was 12 minutes and in review of previous records 15 minutes total.  This virtual service is not related to other E/M service within previous 7 days.   Patient ID: Tammy [name], female    DOB: Sep 02, 1951, 69 y.o.   MRN: [id number]  HPI Chief Complaint  Patient presents with  . coughing and headache    coughing and headache  since last friday. not gotten better.    Complains of a 3-4 day history of dry cough. Also noticed today that her right ear is clogged.  States her cough is all the time. She had a headache 5 days ago but this has resolved. States it was her usual headache.   Denies fever, chills, body aches, dizziness, chest pain, palpitations, shortness of breath, abdominal pain, N/V/D, urinary symptoms, LE edema.   She had Covid in March 2021. Has not had the Covid vaccines and does not want them.   Reviewed allergies, medications, past medical, surgical, family, and social history.   Review of Systems Pertinent positives and negatives in the history of present illness.     Objective:   Physical Exam Wt 160 lb (72.6 kg)   BMI 27.90 kg/m   Alert and oriented in no acute distress.  Speaking in complete sense without difficulty.  Normal speech, mood and thought process.      Assessment & Plan:  Cough - Plan: benzonatate (TESSALON) 200 MG capsule, POC COVID-19, Novel Coronavirus, NAA ([facility])  Acute nonintractable headache, unspecified headache type - Plan: benzonatate (TESSALON) 200 MG capsule, POC COVID-19, Novel Coronavirus, NAA ([facility])  Suspect her symptoms are related  to a viral illness.  She will come to the office for Covid testing this morning.  History of Covid in March.  Has not been vaccinated. Discussed symptomatic treatment and I also prescribed Tessalon for her to use.  Encouraged hydration. Follow-up pending Covid test result and if she is worsening or not back to baseline in 7 to 10 days.

## 2020-10-16 NOTE — Progress Notes (Signed)
Please let her know that her rapid Covid test is negative.  I recommend that we treat her symptoms as we discussed on the telephone.  Follow-up if she is getting worse or not back to baseline in the next week.

## 2020-10-17 LAB — NOVEL CORONAVIRUS, NAA: SARS-CoV-2, NAA: NOT DETECTED

## 2020-10-17 LAB — SARS-COV-2, NAA 2 DAY TAT

## 2020-10-17 NOTE — Progress Notes (Signed)
Her PCR Covid test is also negative. Hopefully she is feeling better.

## 2020-10-18 ENCOUNTER — Other Ambulatory Visit: Payer: Self-pay | Admitting: Internal Medicine

## 2020-10-18 MED ORDER — ALBUTEROL SULFATE HFA 108 (90 BASE) MCG/ACT IN AERS: 2.0000 | INHALATION_SPRAY | Freq: Four times a day (QID) | RESPIRATORY_TRACT | 0 refills | Status: DC | PRN

## 2020-10-18 MED ORDER — AZITHROMYCIN 250 MG PO TABS: ORAL_TABLET | ORAL | 0 refills | Status: DC

## 2020-10-18 NOTE — Progress Notes (Signed)
Please send in a Z-pak and albuterol inhaler for her. See if she is ok with this and if she knows how to use the inhaler. If she continues getting worse then we may need to re-evaluate her next week and consider additional therapies.

## 2020-12-11 ENCOUNTER — Encounter: Payer: Medicare HMO | Admitting: Family Medicine

## 2021-02-17 ENCOUNTER — Other Ambulatory Visit: Payer: Self-pay | Admitting: Family Medicine

## 2021-02-17 DIAGNOSIS — Z634 Disappearance and death of family member: Secondary | ICD-10-CM

## 2021-02-17 DIAGNOSIS — F411 Generalized anxiety disorder: Secondary | ICD-10-CM

## 2021-02-18 NOTE — Telephone Encounter (Signed)
I will refill this but let's get her on my schedule for a virtual visit to discuss this in the next week or two.

## 2021-02-18 NOTE — Telephone Encounter (Signed)
[facility] is requesting to fill pt [facility]anax. Please advise KH 

## 2021-02-26 ENCOUNTER — Telehealth: Payer: Self-pay | Admitting: Family Medicine

## 2021-03-03 NOTE — Progress Notes (Signed)
   Subjective:  Documentation for virtual telephone encounter.   The patient was located at home. 2 patient identifiers used.  The provider was located in the office. The patient did consent to this visit and is aware of possible charges through their insurance for this visit.  The other persons participating in this telemedicine service were none. Time spent on call was 6 minutes and in review of previous records 10 minutes total.  This virtual service is not related to other E/M service within previous 7 days.  99441 (5-32min) 26712 (11-68min) 45809 (21-59min)   Patient ID: WM FRUCHTER, female    DOB: [birth year], 70 y.o.   MRN: [id number]  HPI Chief Complaint  Patient presents with  . follow-up     Follow-up on xanax. Taking as needed   This is a virtual, telephone call only, visit to discuss recent request for Xanax refill. She reports taking it sporadically and only in certain situations that cause her increased anxiety.  States she recently had to go to her sister-in-law's funeral and needed to take a dose.  Last refilled 02/18/2021  No other concerns today.  Review of Systems Pertinent positives and negatives in the history of present illness.     Objective:   Physical Exam Wt 160 lb (72.6 kg)   BMI 27.90 kg/m   Alert and oriented and in no acute distress.  Speaking in complete sentences without difficulty.  Normal speech, mood and thought process.      Assessment & Plan:  GAD (generalized anxiety disorder)  Reports being in her usual state of health. Alprazolam refilled on 02/18/2021.  PDMP reviewed.  She only takes this sporadically. She will follow up with me for her annual wellness visit in June as scheduled.

## 2021-03-04 ENCOUNTER — Other Ambulatory Visit: Payer: Self-pay

## 2021-03-04 ENCOUNTER — Encounter: Payer: Self-pay | Admitting: Family Medicine

## 2021-03-04 ENCOUNTER — Telehealth (INDEPENDENT_AMBULATORY_CARE_PROVIDER_SITE_OTHER): Payer: Medicare HMO | Admitting: Family Medicine

## 2021-03-04 VITALS — Wt 160.0 lb

## 2021-03-04 DIAGNOSIS — F411 Generalized anxiety disorder: Secondary | ICD-10-CM | POA: Diagnosis not present

## 2021-05-27 ENCOUNTER — Other Ambulatory Visit: Payer: Self-pay | Admitting: Family Medicine

## 2021-05-27 DIAGNOSIS — Z634 Disappearance and death of family member: Secondary | ICD-10-CM

## 2021-05-27 DIAGNOSIS — F411 Generalized anxiety disorder: Secondary | ICD-10-CM

## 2021-05-30 ENCOUNTER — Other Ambulatory Visit: Payer: Self-pay

## 2021-05-30 ENCOUNTER — Other Ambulatory Visit: Payer: Self-pay | Admitting: Family Medicine

## 2021-05-30 DIAGNOSIS — Z1231 Encounter for screening mammogram for malignant neoplasm of breast: Secondary | ICD-10-CM

## 2021-06-12 ENCOUNTER — Ambulatory Visit: Payer: Medicare HMO | Admitting: Family Medicine

## 2021-06-13 ENCOUNTER — Other Ambulatory Visit: Payer: Self-pay | Admitting: Family Medicine

## 2021-06-13 DIAGNOSIS — F411 Generalized anxiety disorder: Secondary | ICD-10-CM

## 2021-06-13 DIAGNOSIS — F32A Depression, unspecified: Secondary | ICD-10-CM

## 2021-06-25 DIAGNOSIS — H5200 Hypermetropia, unspecified eye: Secondary | ICD-10-CM | POA: Diagnosis not present

## 2021-06-25 DIAGNOSIS — Z0100 Encounter for examination of eyes and vision without abnormal findings: Secondary | ICD-10-CM | POA: Diagnosis not present

## 2021-06-25 DIAGNOSIS — E119 Type 2 diabetes mellitus without complications: Secondary | ICD-10-CM | POA: Diagnosis not present

## 2021-06-25 LAB — HM DIABETES EYE EXAM

## 2021-06-28 ENCOUNTER — Encounter: Payer: Self-pay | Admitting: Internal Medicine

## 2021-07-24 ENCOUNTER — Ambulatory Visit: Payer: Medicare HMO

## 2021-08-25 ENCOUNTER — Other Ambulatory Visit: Payer: Self-pay | Admitting: Family Medicine

## 2021-09-06 ENCOUNTER — Other Ambulatory Visit (INDEPENDENT_AMBULATORY_CARE_PROVIDER_SITE_OTHER): Payer: Medicare HMO

## 2021-09-06 ENCOUNTER — Encounter: Payer: Self-pay | Admitting: Family Medicine

## 2021-09-06 ENCOUNTER — Other Ambulatory Visit: Payer: Self-pay

## 2021-09-06 ENCOUNTER — Ambulatory Visit (INDEPENDENT_AMBULATORY_CARE_PROVIDER_SITE_OTHER): Payer: Medicare HMO | Admitting: Family Medicine

## 2021-09-06 ENCOUNTER — Telehealth: Payer: Self-pay

## 2021-09-06 ENCOUNTER — Other Ambulatory Visit: Payer: Self-pay | Admitting: Family Medicine

## 2021-09-06 VITALS — Wt 160.0 lb

## 2021-09-06 DIAGNOSIS — R509 Fever, unspecified: Secondary | ICD-10-CM

## 2021-09-06 DIAGNOSIS — R52 Pain, unspecified: Secondary | ICD-10-CM | POA: Diagnosis not present

## 2021-09-06 DIAGNOSIS — R0981 Nasal congestion: Secondary | ICD-10-CM | POA: Diagnosis not present

## 2021-09-06 DIAGNOSIS — R059 Cough, unspecified: Secondary | ICD-10-CM

## 2021-09-06 DIAGNOSIS — R519 Headache, unspecified: Secondary | ICD-10-CM | POA: Diagnosis not present

## 2021-09-06 DIAGNOSIS — J3489 Other specified disorders of nose and nasal sinuses: Secondary | ICD-10-CM | POA: Diagnosis not present

## 2021-09-06 DIAGNOSIS — U071 COVID-19: Secondary | ICD-10-CM

## 2021-09-06 LAB — POC COVID19 BINAXNOW: SARS Coronavirus 2 Ag: POSITIVE — AB

## 2021-09-06 LAB — POCT INFLUENZA A/B
Influenza A, POC: NEGATIVE
Influenza B, POC: NEGATIVE

## 2021-09-06 MED ORDER — NIRMATRELVIR/RITONAVIR (PAXLOVID)TABLET: 3.0000 | ORAL_TABLET | Freq: Two times a day (BID) | ORAL | 0 refills | Status: AC

## 2021-09-06 NOTE — Telephone Encounter (Signed)
Pt is positive. She advised you were going to send in med. KH

## 2021-09-06 NOTE — Progress Notes (Signed)
   Subjective:  Documentation for virtual telephone encounter.   The patient was located at home. The provider was located in the office. The patient did consent to this visit and is aware of possible charges through their insurance for this visit.  The other persons participating in this telemedicine service were none. Time spent on call was 11 minutes and in review of previous records 15 minutes total.  This virtual service is not related to other E/M service within previous 7 days.  99441 (5-52min) 62563 (11-63min) 89373 (21-43min)   Patient ID: Tammy [name], female    DOB: [birth year], 70 y.o.   MRN: [id number]  HPI Chief Complaint  Patient presents with   Cough    With nasal drainage yesterday. Had fever yesterday of 101 via forehead scanning thermometer with chills. Has not had these symptoms today, has not tested for Covid   Headache   Complains of a 2-day history of fever, chills, body aches, headache, rhinorrhea, nasal congestion.  Denies dizziness, chest pain, palpitations, shortness of breath, abdominal pain, nausea, vomiting or diarrhea.  She has not been vaccinated for COVID.   Review of Systems Pertinent positives and negatives in the history of present illness.     Objective:   Physical Exam Wt 160 lb (72.6 kg)   BMI 27.90 kg/m   Alert and oriented and in no acute distress.  Speaking in complete sentences without difficulty.      Assessment & Plan:  Fever and chills  Body aches  Acute nonintractable headache, unspecified headache type  Nasal congestion with rhinorrhea  This is a telephone only visit to discuss acute onset of symptoms.  She will come to the office parking lot for rapid flu, COVID and PCR COVID testing if needed.  Discussed symptomatic treatment and red flag symptoms.  If she is positive, I will send in prescription for pack Slo-Bid.  Discussed how to take the medication and most common side effects.  Answered all questions.

## 2021-09-11 ENCOUNTER — Other Ambulatory Visit: Payer: Self-pay | Admitting: Family Medicine

## 2021-09-11 DIAGNOSIS — F411 Generalized anxiety disorder: Secondary | ICD-10-CM

## 2021-09-11 DIAGNOSIS — F32A Depression, unspecified: Secondary | ICD-10-CM

## 2021-09-12 ENCOUNTER — Ambulatory Visit: Payer: Medicare HMO

## 2021-09-24 ENCOUNTER — Other Ambulatory Visit: Payer: Self-pay | Admitting: Family Medicine

## 2021-09-24 DIAGNOSIS — Z634 Disappearance and death of family member: Secondary | ICD-10-CM

## 2021-09-24 DIAGNOSIS — F411 Generalized anxiety disorder: Secondary | ICD-10-CM

## 2021-10-10 ENCOUNTER — Ambulatory Visit: Payer: Medicare HMO

## 2021-11-12 ENCOUNTER — Telehealth: Payer: Self-pay | Admitting: Family Medicine

## 2021-11-12 NOTE — Telephone Encounter (Signed)
Tried calling patient to  schedule Medicare Annual Wellness Visit (AWV) either virtually or in office.  No answer   Last AWV ;06/11/20  please schedule at anytime with health coach  This should be a 45 minute visit.

## 2021-11-20 ENCOUNTER — Telehealth: Payer: Self-pay | Admitting: Family Medicine

## 2021-11-20 NOTE — Telephone Encounter (Signed)
Tried calling patient to  schedule Medicare Annual Wellness Visit (AWV) either virtually or in office.  No answer   Last AWV ;06/11/20  please schedule at anytime with health coach  This should be a 45 minute visit.   

## 2021-11-27 ENCOUNTER — Telehealth: Payer: Self-pay

## 2021-11-27 ENCOUNTER — Other Ambulatory Visit: Payer: Self-pay

## 2021-11-27 DIAGNOSIS — F32A Depression, unspecified: Secondary | ICD-10-CM

## 2021-11-27 DIAGNOSIS — F411 Generalized anxiety disorder: Secondary | ICD-10-CM

## 2021-11-27 MED ORDER — ESCITALOPRAM OXALATE 20 MG PO TABS: 20.0000 mg | ORAL_TABLET | Freq: Every day | ORAL | 0 refills | Status: DC

## 2021-11-27 NOTE — Telephone Encounter (Signed)
[facility] for [facility]ill on Escitalopram last apt was 09/06/21.

## 2021-11-28 ENCOUNTER — Telehealth: Payer: Self-pay | Admitting: Family Medicine

## 2021-11-28 NOTE — Telephone Encounter (Signed)
Tried calling patient to  schedule Medicare Annual Wellness Visit (AWV) either virtually or in office.  No answer   Last AWV ;06/11/20  please schedule at anytime with health coach  This should be a 45 minute visit.   

## 2021-12-14 DIAGNOSIS — M549 Dorsalgia, unspecified: Secondary | ICD-10-CM | POA: Diagnosis not present

## 2021-12-14 DIAGNOSIS — R350 Frequency of micturition: Secondary | ICD-10-CM | POA: Diagnosis not present

## 2021-12-14 DIAGNOSIS — R509 Fever, unspecified: Secondary | ICD-10-CM | POA: Diagnosis not present

## 2021-12-17 ENCOUNTER — Telehealth: Payer: Self-pay

## 2021-12-17 DIAGNOSIS — F411 Generalized anxiety disorder: Secondary | ICD-10-CM

## 2021-12-17 DIAGNOSIS — Z634 Disappearance and death of family member: Secondary | ICD-10-CM

## 2021-12-17 MED ORDER — ALPRAZOLAM 0.5 MG PO TABS: 0.5000 mg | ORAL_TABLET | Freq: Two times a day (BID) | ORAL | 0 refills | Status: DC | PRN

## 2021-12-17 NOTE — Telephone Encounter (Signed)
[facility]ill request [facility] to [facility]. Last apt was 09/06/21.

## 2021-12-25 ENCOUNTER — Telehealth: Payer: Self-pay | Admitting: Family Medicine

## 2021-12-25 NOTE — Telephone Encounter (Signed)
Tried calling patient to  schedule Medicare Annual Wellness Visit (AWV) either virtually or in office.  No answer   Last AWV ;06/11/20  please schedule at anytime with health coach  This should be a 45 minute visit.   

## 2022-01-16 ENCOUNTER — Other Ambulatory Visit: Payer: Self-pay | Admitting: Medical

## 2022-01-16 DIAGNOSIS — F411 Generalized anxiety disorder: Secondary | ICD-10-CM

## 2022-01-16 DIAGNOSIS — F32A Depression, unspecified: Secondary | ICD-10-CM

## 2022-01-22 ENCOUNTER — Telehealth: Payer: Self-pay | Admitting: Family Medicine

## 2022-01-22 NOTE — Telephone Encounter (Signed)
Tried calling patient to  schedule Medicare Annual Wellness Visit (AWV) either virtually or in office.  No answer   Last AWV ;06/11/20  please schedule at anytime with health coach  This should be a 45 minute visit.   

## 2022-01-28 ENCOUNTER — Telehealth: Payer: Self-pay | Admitting: Family Medicine

## 2022-01-28 ENCOUNTER — Other Ambulatory Visit: Payer: Self-pay | Admitting: Medical

## 2022-01-28 DIAGNOSIS — F32A Depression, unspecified: Secondary | ICD-10-CM

## 2022-01-28 DIAGNOSIS — F411 Generalized anxiety disorder: Secondary | ICD-10-CM

## 2022-01-28 NOTE — Telephone Encounter (Signed)
Tried calling patient to  schedule Medicare Annual Wellness Visit (AWV) either virtually or in office.  No answer   Last AWV ;06/11/20  please schedule at anytime with health coach  This should be a 45 minute visit.   

## 2022-02-21 ENCOUNTER — Ambulatory Visit (INDEPENDENT_AMBULATORY_CARE_PROVIDER_SITE_OTHER): Payer: Medicare HMO

## 2022-02-21 VITALS — Ht 63.0 in | Wt 160.0 lb

## 2022-02-21 DIAGNOSIS — Z0000 Encounter for general adult medical examination without abnormal findings: Secondary | ICD-10-CM

## 2022-02-21 NOTE — Patient Instructions (Signed)
Tammy [name] , Thank you for taking time to come for your Medicare Wellness Visit. I appreciate your ongoing commitment to your health goals. Please review the following plan we discussed and let me know if I can assist you in the future.   Screening recommendations/referrals: Colonoscopy: completed 09/30/2013, due 10/01/2023 Mammogram: patient to schedule Bone Density: completed 01/03/2015 Recommended yearly ophthalmology/optometry visit for glaucoma screening and checkup Recommended yearly dental visit for hygiene and checkup  Vaccinations: Influenza vaccine: decline this season Pneumococcal vaccine: due Tdap vaccine: due Shingles vaccine: discussed   Covid-19: decline  Advanced directives: Advance directive discussed with you today.   Conditions/risks identified: none  Next appointment: Follow up in one year for your annual wellness visit    Preventive Care 65 Years and Older, Female Preventive care refers to lifestyle choices and visits with your health care provider that can promote health and wellness. What does preventive care include? A yearly physical exam. This is also called an annual well check. Dental exams once or twice a year. Routine eye exams. Ask your health care provider how often you should have your eyes checked. Personal lifestyle choices, including: Daily care of your teeth and gums. Regular physical activity. Eating a healthy diet. Avoiding tobacco and drug use. Limiting alcohol use. Practicing safe sex. Taking low-dose aspirin every day. Taking vitamin and mineral supplements as recommended by your health care provider. What happens during an annual well check? The services and screenings done by your health care provider during your annual well check will depend on your age, overall health, lifestyle risk factors, and family history of disease. Counseling  Your health care provider may ask you questions about your: Alcohol use. Tobacco use. Drug  use. Emotional well-being. Home and relationship well-being. Sexual activity. Eating habits. History of falls. Memory and ability to understand (cognition). Work and work Astronomer. Reproductive health. Screening  You may have the following tests or measurements: Height, weight, and BMI. Blood pressure. Lipid and cholesterol levels. These may be checked every 5 years, or more frequently if you are over 70 years old. Skin check. Lung cancer screening. You may have this screening every year starting at age 70 if you have a 30-pack-year history of smoking and currently smoke or have quit within the past 15 years. Fecal occult blood test (FOBT) of the stool. You may have this test every year starting at age 42. Flexible sigmoidoscopy or colonoscopy. You may have a sigmoidoscopy every 5 years or a colonoscopy every 10 years starting at age 21. Hepatitis C blood test. Hepatitis B blood test. Sexually transmitted disease (STD) testing. Diabetes screening. This is done by checking your blood sugar (glucose) after you have not eaten for a while (fasting). You may have this done every 1-3 years. Bone density scan. This is done to screen for osteoporosis. You may have this done starting at age 70. Mammogram. This may be done every 1-2 years. Talk to your health care provider about how often you should have regular mammograms. Talk with your health care provider about your test results, treatment options, and if necessary, the need for more tests. Vaccines  Your health care provider may recommend certain vaccines, such as: Influenza vaccine. This is recommended every year. Tetanus, diphtheria, and acellular pertussis (Tdap, Td) vaccine. You may need a Td booster every 10 years. Zoster vaccine. You may need this after age 21. Pneumococcal 13-valent conjugate (PCV13) vaccine. One dose is recommended after age 70. Pneumococcal polysaccharide (PPSV23) vaccine. One dose is recommended  after age  70. Talk to your health care provider about which screenings and vaccines you need and how often you need them. This information is not intended to replace advice given to you by your health care provider. Make sure you discuss any questions you have with your health care provider. Document Released: 12/28/2015 Document Revised: 08/20/2016 Document Reviewed: 10/02/2015 Elsevier Interactive Patient Education  2017 [employer].  Fall Prevention in the Home Falls can cause injuries. They can happen to people of all ages. There are many things you can do to make your home safe and to help prevent falls. What can I do on the outside of my home? Regularly fix the edges of walkways and driveways and fix any cracks. Remove anything that might make you trip as you walk through a door, such as a raised step or threshold. Trim any bushes or trees on the path to your home. Use bright outdoor lighting. Clear any walking paths of anything that might make someone trip, such as rocks or tools. Regularly check to see if handrails are loose or broken. Make sure that both sides of any steps have handrails. Any raised decks and porches should have guardrails on the edges. Have any leaves, snow, or ice cleared regularly. Use sand or salt on walking paths during winter. Clean up any spills in your garage right away. This includes oil or grease spills. What can I do in the bathroom? Use night lights. Install grab bars by the toilet and in the tub and shower. Do not use towel bars as grab bars. Use non-skid mats or decals in the tub or shower. If you need to sit down in the shower, use a plastic, non-slip stool. Keep the floor dry. Clean up any water that spills on the floor as soon as it happens. Remove soap buildup in the tub or shower regularly. Attach bath mats securely with double-sided non-slip rug tape. Do not have throw rugs and other things on the floor that can make you trip. What can I do in the  bedroom? Use night lights. Make sure that you have a light by your bed that is easy to reach. Do not use any sheets or blankets that are too big for your bed. They should not hang down onto the floor. Have a firm chair that has side arms. You can use this for support while you get dressed. Do not have throw rugs and other things on the floor that can make you trip. What can I do in the kitchen? Clean up any spills right away. Avoid walking on wet floors. Keep items that you use a lot in easy-to-reach places. If you need to reach something above you, use a strong step stool that has a grab bar. Keep electrical cords out of the way. Do not use floor polish or wax that makes floors slippery. If you must use wax, use non-skid floor wax. Do not have throw rugs and other things on the floor that can make you trip. What can I do with my stairs? Do not leave any items on the stairs. Make sure that there are handrails on both sides of the stairs and use them. Fix handrails that are broken or loose. Make sure that handrails are as long as the stairways. Check any carpeting to make sure that it is firmly attached to the stairs. Fix any carpet that is loose or worn. Avoid having throw rugs at the top or bottom of the stairs. If you  do have throw rugs, attach them to the floor with carpet tape. Make sure that you have a light switch at the top of the stairs and the bottom of the stairs. If you do not have them, ask someone to add them for you. What else can I do to help prevent falls? Wear shoes that: Do not have high heels. Have rubber bottoms. Are comfortable and fit you well. Are closed at the toe. Do not wear sandals. If you use a stepladder: Make sure that it is fully opened. Do not climb a closed stepladder. Make sure that both sides of the stepladder are locked into place. Ask someone to hold it for you, if possible. Clearly mark and make sure that you can see: Any grab bars or  handrails. First and last steps. Where the edge of each step is. Use tools that help you move around (mobility aids) if they are needed. These include: Canes. Walkers. Scooters. Crutches. Turn on the lights when you go into a dark area. Replace any light bulbs as soon as they burn out. Set up your furniture so you have a clear path. Avoid moving your furniture around. If any of your floors are uneven, fix them. If there are any pets around you, be aware of where they are. Review your medicines with your doctor. Some medicines can make you feel dizzy. This can increase your chance of falling. Ask your doctor what other things that you can do to help prevent falls. This information is not intended to replace advice given to you by your health care provider. Make sure you discuss any questions you have with your health care provider. Document Released: 09/27/2009 Document Revised: 05/08/2016 Document Reviewed: 01/05/2015 Elsevier Interactive Patient Education  2017 [employer].

## 2022-02-21 NOTE — Progress Notes (Signed)
I connected with Tammy [name] today by telephone and verified that I am speaking with the correct person using two identifiers. Location patient: home Location provider: work Persons participating in the virtual visit: Jewel, Thull LPN.   I discussed the limitations, risks, security and privacy concerns of performing an evaluation and management service by telephone and the availability of in person appointments. I also discussed with the patient that there may be a patient responsible charge related to this service. The patient expressed understanding and verbally consented to this telephonic visit.    Interactive audio and video telecommunications were attempted between this provider and patient, however failed, due to patient having technical difficulties OR patient did not have access to video capability.  We continued and completed visit with audio only.     Vital signs may be patient reported or missing.  Subjective:   Tammy [name] is a 71 y.o. female who presents for Medicare Annual (Subsequent) preventive examination.  Review of Systems     Cardiac Risk Factors include: advanced age (>15men, >10 women)     Objective:    Today's Vitals   02/21/22 1026  Weight: 160 lb (72.6 kg)  Height: 5\' 3"  (1.6 m)   Body mass index is 28.34 kg/m.  Advanced Directives 02/21/2022 03/19/2020 03/14/2020  Does Patient Have a Medical Advance Directive? No No No    Current Medications (verified) Outpatient Encounter Medications as of 02/21/2022  Medication Sig   ALPRAZolam (XANAX) 0.5 MG tablet Take 1 tablet (0.5 mg total) by mouth 2 (two) times daily as needed for anxiety.   escitalopram (LEXAPRO) 20 MG tablet TAKE 1 TABLET BY MOUTH EVERY DAY   omeprazole (PRILOSEC) 40 MG capsule TAKE 1 CAPSULE BY MOUTH EVERY DAY   valACYclovir (VALTREX) 1000 MG tablet Take 2 tablets (2,000 mg) by mouth every 12 hours x 1 day at onset of symptoms.   No facility-administered encounter  medications on file as of 02/21/2022.    Allergies (verified) Patient has no known allergies.   History: Past Medical History:  Diagnosis Date   Depression    GAD (generalized anxiety disorder)    GERD (gastroesophageal reflux disease)    History of COVID-19    Prediabetes    Past Surgical History:  Procedure Laterality Date   CESAREAN SECTION     History reviewed. No pertinent family history. Social History   Socioeconomic History   Marital status: Widowed    Spouse name: Not on file   Number of children: Not on file   Years of education: Not on file   Highest education level: Not on file  Occupational History   Not on file  Tobacco Use   Smoking status: Never   Smokeless tobacco: Never  Vaping Use   Vaping Use: Never used  Substance and Sexual Activity   Alcohol use: No   Drug use: No   Sexual activity: Not Currently  Other Topics Concern   Not on file  Social History Narrative   Not on file   Social Determinants of Health   Financial Resource Strain: Low Risk    Difficulty of Paying Living Expenses: Not hard at all  Food Insecurity: No Food Insecurity   Worried About Running Out of Food in the Last Year: Never true   Banks in the Last Year: Never true  Transportation Needs: No Transportation Needs   Lack of Transportation (Medical): No   Lack of Transportation (Non-Medical): No  Physical Activity: Sufficiently  Active   Days of Exercise per Week: 7 days   Minutes of Exercise per Session: 30 min  Stress: Stress Concern Present   Feeling of Stress : To some extent  Social Connections: Not on file    Tobacco Counseling Counseling given: Not Answered   Clinical Intake:  Pre-visit preparation completed: Yes  Pain : No/denies pain     Nutritional Status: BMI 25 -29 Overweight Nutritional Risks: None Diabetes: No  How often do you need to have someone help you when you read instructions, pamphlets, or other written materials from your  doctor or pharmacy?: 1 - Never What is the last grade level you completed in school?: 10 th grade  Diabetic? no  Interpreter Needed?: No  Information entered by :: NAllen LPN   Activities of Daily Living In your present state of health, do you have any difficulty performing the following activities: 02/21/2022  Hearing? N  Vision? N  Difficulty concentrating or making decisions? N  Walking or climbing stairs? N  Dressing or bathing? N  Doing errands, shopping? N  Preparing Food and eating ? N  Using the Toilet? N  In the past six months, have you accidently leaked urine? Y  Do you have problems with loss of bowel control? N  Managing your Medications? N  Managing your Finances? N  Housekeeping or managing your Housekeeping? N  Some recent data might be hidden    Patient Care Team: Davy Pique as PCP - General (Family Medicine)  Indicate any recent Medical Services you may have received from other than [facility] providers in the past year (date may be approximate).     Assessment:   This is a routine wellness examination for Panhia.  Hearing/Vision screen Vision Screening - Comments:: Regular eye exams, [facility]  Dietary issues and exercise activities discussed: Current Exercise Habits: Home exercise routine, Type of exercise: walking, Time (Minutes): 30, Frequency (Times/Week): 7, Weekly Exercise (Minutes/Week): 210   Goals Addressed             This Visit's Progress    Patient Stated       02/21/2022, no goals       Depression Screen PHQ 2/9 Scores 02/21/2022 09/06/2021 06/11/2020 06/02/2019 03/23/2019 05/05/2018  PHQ - 2 Score 0 0 0 0 0 0    Fall Risk Fall Risk  02/21/2022 09/06/2021 06/11/2020 06/02/2019 05/05/2018  Falls in the past year? 0 0 0 0 Yes  Number falls in past yr: - 0 0 0 1  Injury with Fall? - 0 0 0 Yes  Risk for fall due to : Medication side effect No Fall Risks - - -  Follow up Falls evaluation completed;Education provided;Falls  prevention discussed Falls evaluation completed - - -    FALL RISK PREVENTION PERTAINING TO THE HOME:  Any stairs in or around the home? Yes  If so, are there any without handrails? No  Home free of loose throw rugs in walkways, pet beds, electrical cords, etc? Yes  Adequate lighting in your home to reduce risk of falls? Yes   ASSISTIVE DEVICES UTILIZED TO PREVENT FALLS:  Life alert? No  Use of a cane, walker or w/c? No  Grab bars in the bathroom? No  Shower chair or bench in shower? No  Elevated toilet seat or a handicapped toilet? Yes   TIMED UP AND GO:  Was the test performed? No .       Cognitive Function:     6CIT  Screen 02/21/2022  What Year? 0 points  What month? 0 points  What time? 0 points  Count back from 20 0 points  Months in reverse 0 points  Repeat phrase 6 points  Total Score 6    Immunizations Immunization History  Administered Date(s) Administered   Fluad Quad(high Dose 65+) 09/29/2019   Pneumococcal Polysaccharide-23 06/02/2019    TDAP status: Due, Education has been provided regarding the importance of this vaccine. Advised may receive this vaccine at local pharmacy or Health Dept. Aware to provide a copy of the vaccination record if obtained from local pharmacy or Health Dept. Verbalized acceptance and understanding.  Flu Vaccine status: Due, Education has been provided regarding the importance of this vaccine. Advised may receive this vaccine at local pharmacy or Health Dept. Aware to provide a copy of the vaccination record if obtained from local pharmacy or Health Dept. Verbalized acceptance and understanding.  Pneumococcal vaccine status: Due, Education has been provided regarding the importance of this vaccine. Advised may receive this vaccine at local pharmacy or Health Dept. Aware to provide a copy of the vaccination record if obtained from local pharmacy or Health Dept. Verbalized acceptance and understanding.  Covid-19 vaccine status:  Declined, Education has been provided regarding the importance of this vaccine but patient still declined. Advised may receive this vaccine at local pharmacy or Health Dept.or vaccine clinic. Aware to provide a copy of the vaccination record if obtained from local pharmacy or Health Dept. Verbalized acceptance and understanding.  Qualifies for Shingles Vaccine? Yes   Zostavax completed No   Shingrix Completed?: No.    Education has been provided regarding the importance of this vaccine. Patient has been advised to call insurance company to determine out of pocket expense if they have not yet received this vaccine. Advised may also receive vaccine at local pharmacy or Health Dept. Verbalized acceptance and understanding.  Screening Tests Health Maintenance  Topic Date Due   TETANUS/TDAP  Never done   Zoster Vaccines- Shingrix (1 of 2) Never done   Pneumonia Vaccine 59+ Years old (2 - PCV) 06/01/2020   COVID-19 Vaccine (1) 03/09/2022 (Originally 05/19/1952)   INFLUENZA VACCINE  03/14/2022 (Originally 07/15/2021)   MAMMOGRAM  05/02/2022   COLONOSCOPY (Pts 45-23yrs Insurance coverage will need to be confirmed)  10/01/2023   DEXA SCAN  Completed   Hepatitis C Screening  Completed   HPV VACCINES  Aged Out    Health Maintenance  Health Maintenance Due  Topic Date Due   TETANUS/TDAP  Never done   Zoster Vaccines- Shingrix (1 of 2) Never done   Pneumonia Vaccine 56+ Years old (2 - PCV) 06/01/2020    Colorectal cancer screening: Type of screening: Colonoscopy. Completed 09/30/2013. Repeat every 10 years  Mammogram status: patient to schedule  Bone Density status: Completed 01/03/2015.   Lung Cancer Screening: (Low Dose CT Chest recommended if Age 15-80 years, 30 pack-year currently smoking OR have quit w/in 15years.) does not qualify.   Lung Cancer Screening Referral: no  Additional Screening:  Hepatitis C Screening: does qualify; Completed 06/02/2019  Vision Screening: Recommended annual  ophthalmology exams for early detection of glaucoma and other disorders of the eye. Is the patient up to date with their annual eye exam?  Yes  Who is the provider or what is the name of the office in which the patient attends annual eye exams? [facility] If pt is not established with a provider, would they like to be referred to a provider to  establish care? No .   Dental Screening: Recommended annual dental exams for proper oral hygiene  Community Resource Referral / Chronic Care Management: CRR required this visit?  No   CCM required this visit?  No      Plan:     I have personally reviewed and noted the following in the patients chart:   Medical and social history Use of alcohol, tobacco or illicit drugs  Current medications and supplements including opioid prescriptions.  Functional ability and status Nutritional status Physical activity Advanced directives List of other physicians Hospitalizations, surgeries, and ER visits in previous 12 months Vitals Screenings to include cognitive, depression, and falls Referrals and appointments  In addition, I have reviewed and discussed with patient certain preventive protocols, quality metrics, and best practice recommendations. A written personalized care plan for preventive services as well as general preventive health recommendations were provided to patient.     Kellie Simmering, LPN   [id number]   Nurse Notes: none  Due to this being a virtual visit, the after visit summary with patients personalized plan was offered to patient via mail or my-chart. Patient would like to access on my-chart

## 2022-02-25 ENCOUNTER — Other Ambulatory Visit: Payer: Self-pay | Admitting: Physician Assistant

## 2022-02-25 DIAGNOSIS — Z1231 Encounter for screening mammogram for malignant neoplasm of breast: Secondary | ICD-10-CM

## 2022-02-27 ENCOUNTER — Ambulatory Visit
Admission: RE | Admit: 2022-02-27 | Discharge: 2022-02-27 | Disposition: A | Discharge status: Home / Self Care | Payer: Medicare HMO | Source: Ambulatory Visit | Attending: Physician Assistant | Admitting: Physician Assistant

## 2022-02-27 DIAGNOSIS — Z1231 Encounter for screening mammogram for malignant neoplasm of breast: Secondary | ICD-10-CM | POA: Diagnosis not present

## 2022-03-11 ENCOUNTER — Other Ambulatory Visit: Payer: Self-pay | Admitting: Family Medicine

## 2022-03-11 DIAGNOSIS — F411 Generalized anxiety disorder: Secondary | ICD-10-CM

## 2022-03-11 DIAGNOSIS — Z634 Disappearance and death of family member: Secondary | ICD-10-CM

## 2022-03-12 ENCOUNTER — Other Ambulatory Visit: Payer: Self-pay

## 2022-03-12 MED ORDER — OMEPRAZOLE 40 MG PO CPDR: 40.0000 mg | DELAYED_RELEASE_CAPSULE | Freq: Every day | ORAL | 0 refills | Status: DC

## 2022-03-12 NOTE — Telephone Encounter (Signed)
Is this okay to refill? 

## 2022-03-13 ENCOUNTER — Other Ambulatory Visit: Payer: Self-pay | Admitting: Physician Assistant

## 2022-03-13 DIAGNOSIS — F411 Generalized anxiety disorder: Secondary | ICD-10-CM

## 2022-03-13 DIAGNOSIS — Z634 Disappearance and death of family member: Secondary | ICD-10-CM

## 2022-03-13 MED ORDER — ALPRAZOLAM 0.5 MG PO TABS: 0.5000 mg | ORAL_TABLET | Freq: Two times a day (BID) | ORAL | 1 refills | Status: DC | PRN

## 2022-03-13 NOTE — Progress Notes (Signed)
I have logged into and reviewed this patient's PMPAware data today prior to issuing prescriptions for any controlled substances.  

## 2022-04-14 ENCOUNTER — Other Ambulatory Visit: Payer: Self-pay | Admitting: Physician Assistant

## 2022-04-14 ENCOUNTER — Telehealth: Payer: Self-pay

## 2022-04-14 DIAGNOSIS — F32A Depression, unspecified: Secondary | ICD-10-CM

## 2022-04-14 DIAGNOSIS — F411 Generalized anxiety disorder: Secondary | ICD-10-CM

## 2022-04-14 MED ORDER — ESCITALOPRAM OXALATE 20 MG PO TABS: 20.0000 mg | ORAL_TABLET | Freq: Every day | ORAL | 1 refills | Status: DC

## 2022-04-14 NOTE — Telephone Encounter (Signed)
Refill ordered.  Thanks

## 2022-04-14 NOTE — Telephone Encounter (Signed)
Recv'd fax from [facility] requesting Escitalopram 20mg  #90

## 2022-04-21 NOTE — Progress Notes (Signed)
Established Patient Office Visit  Subjective:  Patient ID: Tammy [name], female    DOB: [birth year]  Age: 71 y.o. MRN: [id number]  CC:  Chief Complaint  Patient presents with   Medication Refill    Fasting Med check- Lower back pain and frequency. Pt was able to leave a specimen     HPI Tammy [name] presents for a follow up appointment 2 weeks ago had low back pain; denies falls or injury; also had urinary hesitancy and frequency; denies blood in urine; denies hx/o "kidney stone"; was working in the yard may have caused some back pain   Outpatient Medications Prior to Visit  Medication Sig Dispense Refill   ALPRAZolam (XANAX) 0.5 MG tablet Take 1 tablet (0.5 mg total) by mouth 2 (two) times daily as needed for anxiety. 30 tablet 1   escitalopram (LEXAPRO) 20 MG tablet Take 1 tablet (20 mg total) by mouth daily. 90 tablet 1   valACYclovir (VALTREX) 1000 MG tablet Take 2 tablets (2,000 mg) by mouth every 12 hours x 1 day at onset of symptoms. 30 tablet 1   omeprazole (PRILOSEC) 40 MG capsule Take 1 capsule (40 mg total) by mouth daily. 90 capsule 0   No facility-administered medications prior to visit.    No Known Allergies  Patient Care Team: Lexine Baton as PCP - General (Physician Assistant)  ROS Review of Systems  Constitutional:  Negative for activity change and chills.  HENT:  Negative for congestion and voice change.   Eyes:  Negative for pain and redness.  Respiratory:  Negative for cough and wheezing.   Cardiovascular:  Negative for chest pain.  Gastrointestinal:  Negative for constipation, diarrhea, nausea and vomiting.  Endocrine: Negative for polyuria.  Genitourinary:  Positive for dysuria. Negative for frequency.  Skin:  Negative for color change and rash.  Allergic/Immunologic: Negative for immunocompromised state.  Neurological:  Negative for dizziness.  Psychiatric/Behavioral:  Negative for agitation. The patient is nervous/anxious.       Objective:    Physical Exam Vitals and nursing note reviewed.  Constitutional:      General: She is not in acute distress.    Appearance: She is normal weight. She is not ill-appearing.  HENT:     Head: Normocephalic and atraumatic.     Right Ear: External ear normal.     Left Ear: External ear normal.  Eyes:     Extraocular Movements: Extraocular movements intact.     Conjunctiva/sclera: Conjunctivae normal.     Pupils: Pupils are equal, round, and reactive to light.  Cardiovascular:     Rate and Rhythm: Normal rate and regular rhythm.  Pulmonary:     Effort: Pulmonary effort is normal.     Breath sounds: Normal breath sounds. No wheezing.  Abdominal:     General: Bowel sounds are normal.     Palpations: Abdomen is soft.     Tenderness: There is no right CVA tenderness or left CVA tenderness.  Musculoskeletal:        General: Normal range of motion.     Cervical back: Normal range of motion.  Skin:    General: Skin is warm and dry.  Neurological:     Mental Status: She is alert and oriented to person, place, and time.  Psychiatric:        Mood and Affect: Mood normal.    BP 120/80   Pulse 69   Ht 5\' 3"  (1.6 m)   Wt 165 lb  9.6 oz (75.1 kg)   SpO2 97%   BMI 29.33 kg/m   Wt Readings from Last 3 Encounters:  04/22/22 165 lb 9.6 oz (75.1 kg)  02/21/22 160 lb (72.6 kg)  09/06/21 160 lb (72.6 kg)    Results for orders placed or performed in visit on 04/22/22  POCT URINALYSIS DIP (CLINITEK)  Result Value Ref Range   Color, UA yellow yellow   Clarity, UA clear clear   Glucose, UA negative negative mg/dL   Bilirubin, UA negative negative   Ketones, POC UA negative negative mg/dL   Spec Grav, UA >=1.610 (A) 1.010 - 1.025   Blood, UA negative negative   pH, UA 6.0 5.0 - 8.0   POC PROTEIN,UA negative negative, trace   Urobilinogen, UA 0.2 0.2 or 1.0 E.U./dL   Nitrite, UA Negative Negative   Leukocytes, UA Negative Negative     Last CBC Lab Results  Component  Value Date   WBC 6.8 06/11/2020   HGB 13.0 06/11/2020   HCT 38.2 06/11/2020   MCV 95 06/11/2020   MCH 32.2 06/11/2020   RDW 12.7 06/11/2020   PLT 414 06/11/2020   Last metabolic panel Lab Results  Component Value Date   GLUCOSE 125 (H) 06/11/2020   NA 141 06/11/2020   K 4.7 06/11/2020   CL 104 06/11/2020   CO2 25 06/11/2020   BUN 18 06/11/2020   CREATININE 0.80 06/11/2020   GFRNONAA 77 06/11/2020   CALCIUM 9.5 06/11/2020   PROT 6.8 06/11/2020   ALBUMIN 4.2 06/11/2020   LABGLOB 2.6 06/11/2020   AGRATIO 1.6 06/11/2020   BILITOT 0.3 06/11/2020   ALKPHOS 81 06/11/2020   AST 22 06/11/2020   ALT 24 06/11/2020   ANIONGAP 15 03/14/2020   Last lipids Lab Results  Component Value Date   CHOL 252 (H) 06/11/2020   HDL 41 06/11/2020   LDLCALC 158 (H) 06/11/2020   TRIG 282 (H) 06/11/2020   CHOLHDL 6.1 (H) 06/11/2020   Last hemoglobin A1c Lab Results  Component Value Date   HGBA1C 5.9 (A) 03/14/2020   Last thyroid functions Lab Results  Component Value Date   TSH 1.720 06/11/2020   T3TOTAL 106 06/11/2020   Last vitamin D Lab Results  Component Value Date   VD25OH 22.7 (L) 06/11/2020   Last vitamin B12 and Folate Lab Results  Component Value Date   VITAMINB12 451 06/02/2019      The 10-year ASCVD risk score (Arnett DK, et al., 2019) is: 14.3%    Assessment & Plan:   Problem List Items Addressed This Visit       Digestive   Herpes labialis    Stable, valtrex refilled       Relevant Medications   valACYclovir (VALTREX) 1000 MG tablet     Other   GAD (generalized anxiety disorder)    Stable, alprazolam refilled       Relevant Medications   escitalopram (LEXAPRO) 20 MG tablet   ALPRAZolam (XANAX) 0.5 MG tablet   Prediabetes    controlled, eat a low sugar diet, avoid starchy food with a lot of carbohydrates, avoid fried and processed foods; last hgb a1c 5.9 on 03/14/2020       Relevant Orders   Comprehensive metabolic panel   Hemoglobin A1c    Vitamin D deficiency    Stable, Vitamin D level checked today       Relevant Orders   VITAMIN D 25 Hydroxy (Vit-D Deficiency, Fractures)   Mixed hyperlipidemia  controlled, eat a low fat diet, increase fiber intake (Benefiber or Metamucil, Cherrios,  oatmeal, beans, nuts, fruits and vegetables), limit saturated fats (in fried foods, red meat), can add OTC fish oil supplement, eat fish with Omega-3 fatty acids like salmon and tuna, exercise for 30 minutes 3 - 5 times a week, drink 8 - 10 glasses of water a day        Relevant Orders   Lipid panel   Body mass index (BMI) of 29.0-29.9 in adult    Stable, will monitor       Chronic bilateral low back pain without sciatica    Stable, urinalysis normal today, will monitor       Relevant Medications   escitalopram (LEXAPRO) 20 MG tablet   Other Visit Diagnoses     Urinary frequency    -  Primary   Relevant Orders   POCT URINALYSIS DIP (CLINITEK) (Completed) -urinalysis normal today       Meds ordered this encounter  Medications   omeprazole (PRILOSEC) 40 MG capsule    Sig: Take 1 capsule (40 mg total) by mouth daily.    Dispense:  90 capsule    Refill:  1    Order Specific Question:   Supervising Provider    Answer:   Ronnald Nian [[id number]]   valACYclovir (VALTREX) 1000 MG tablet    Sig: Take 2 tablets (2,000 mg) by mouth every 12 hours x 1 day at onset of symptoms.    Dispense:  30 tablet    Refill:  1    Order Specific Question:   Supervising Provider    Answer:   Ronnald Nian [[id number]]   escitalopram (LEXAPRO) 20 MG tablet    Sig: Take 1 tablet (20 mg total) by mouth daily.    Dispense:  90 tablet    Refill:  1    Order Specific Question:   Supervising Provider    Answer:   Ronnald Nian [[id number]]   ALPRAZolam (XANAX) 0.5 MG tablet    Sig: Take 1 tablet (0.5 mg total) by mouth at bedtime as needed for anxiety.    Dispense:  30 tablet    Refill:  1    Not to exceed 5 additional fills before 11/24/2021     Order Specific Question:   Supervising Provider    Answer:   Ronnald Nian [[id number]]    Follow-up: Return in about 6 months (around 10/23/2022) for Return for Annual Exam with PCP Mayford Knife.   I have logged into and reviewed this patient's PMPAware data today prior to issuing prescriptions for any controlled substances.   Jake Shark, PA-C

## 2022-04-22 ENCOUNTER — Ambulatory Visit (INDEPENDENT_AMBULATORY_CARE_PROVIDER_SITE_OTHER): Payer: Medicare HMO | Admitting: Physician Assistant

## 2022-04-22 ENCOUNTER — Encounter: Payer: Self-pay | Admitting: Physician Assistant

## 2022-04-22 VITALS — BP 120/80 | HR 69 | Ht 63.0 in | Wt 165.6 lb

## 2022-04-22 DIAGNOSIS — R350 Frequency of micturition: Secondary | ICD-10-CM | POA: Diagnosis not present

## 2022-04-22 DIAGNOSIS — E782 Mixed hyperlipidemia: Secondary | ICD-10-CM

## 2022-04-22 DIAGNOSIS — E559 Vitamin D deficiency, unspecified: Secondary | ICD-10-CM | POA: Diagnosis not present

## 2022-04-22 DIAGNOSIS — G8929 Other chronic pain: Secondary | ICD-10-CM | POA: Diagnosis not present

## 2022-04-22 DIAGNOSIS — B001 Herpesviral vesicular dermatitis: Secondary | ICD-10-CM | POA: Diagnosis not present

## 2022-04-22 DIAGNOSIS — K5730 Diverticulosis of large intestine without perforation or abscess without bleeding: Secondary | ICD-10-CM | POA: Insufficient documentation

## 2022-04-22 DIAGNOSIS — Z6829 Body mass index (BMI) 29.0-29.9, adult: Secondary | ICD-10-CM | POA: Diagnosis not present

## 2022-04-22 DIAGNOSIS — R194 Change in bowel habit: Secondary | ICD-10-CM | POA: Insufficient documentation

## 2022-04-22 DIAGNOSIS — R7303 Prediabetes: Secondary | ICD-10-CM

## 2022-04-22 DIAGNOSIS — F411 Generalized anxiety disorder: Secondary | ICD-10-CM

## 2022-04-22 DIAGNOSIS — M545 Low back pain, unspecified: Secondary | ICD-10-CM

## 2022-04-22 LAB — POCT URINALYSIS DIP (CLINITEK)
Bilirubin, UA: NEGATIVE
Blood, UA: NEGATIVE
Glucose, UA: NEGATIVE mg/dL
Ketones, POC UA: NEGATIVE mg/dL
Leukocytes, UA: NEGATIVE
Nitrite, UA: NEGATIVE
POC PROTEIN,UA: NEGATIVE
Spec Grav, UA: >=1.03 — AB (ref 1.010–1.025)
Urobilinogen, UA: 0.2 U/dL
pH, UA: 6 (ref 5.0–8.0)

## 2022-04-22 MED ORDER — VALACYCLOVIR HCL 1 G PO TABS: ORAL_TABLET | ORAL | 1 refills | Status: AC

## 2022-04-22 MED ORDER — ALPRAZOLAM 0.5 MG PO TABS: 0.5000 mg | ORAL_TABLET | Freq: Every evening | ORAL | 1 refills | Status: DC | PRN

## 2022-04-22 MED ORDER — ESCITALOPRAM OXALATE 20 MG PO TABS: 20.0000 mg | ORAL_TABLET | Freq: Every day | ORAL | 1 refills | Status: DC

## 2022-04-22 MED ORDER — OMEPRAZOLE 40 MG PO CPDR: 40.0000 mg | DELAYED_RELEASE_CAPSULE | Freq: Every day | ORAL | 1 refills | Status: DC

## 2022-04-22 NOTE — Assessment & Plan Note (Signed)
Stable, will monitor 

## 2022-04-22 NOTE — Assessment & Plan Note (Signed)
Stable, Vitamin D level checked today ?

## 2022-04-22 NOTE — Assessment & Plan Note (Signed)
Stable, alprazolam refilled ?

## 2022-04-22 NOTE — Assessment & Plan Note (Signed)
Stable, valtrex refilled ?

## 2022-04-22 NOTE — Assessment & Plan Note (Signed)
controlled, eat a low fat diet, increase fiber intake (Benefiber or Metamucil, Cherrios,  oatmeal, beans, nuts, fruits and vegetables), limit saturated fats (in fried foods, red meat), can add OTC fish oil supplement, eat fish with Omega-3 fatty acids like salmon and tuna, exercise for 30 minutes 3 - 5 times a week, drink 8 - 10 glasses of water a day ? ? ?

## 2022-04-22 NOTE — Assessment & Plan Note (Signed)
Stable, urinalysis normal today, will monitor ?

## 2022-04-22 NOTE — Assessment & Plan Note (Signed)
controlled, eat a low sugar diet, avoid starchy food with a lot of carbohydrates, avoid fried and processed foods; last hgb a1c 5.9 on 03/14/2020 ?

## 2022-04-23 LAB — COMPREHENSIVE METABOLIC PANEL
ALT: 26 [IU]/L (ref 0–32)
AST: 28 [IU]/L (ref 0–40)
Albumin/Globulin Ratio: 1.5 (ref 1.2–2.2)
Albumin: 4.1 g/dL (ref 3.8–4.8)
Alkaline Phosphatase: 79 [IU]/L (ref 44–121)
BUN/Creatinine Ratio: 20 (ref 12–28)
BUN: 16 mg/dL (ref 8–27)
Bilirubin Total: 0.4 mg/dL (ref 0.0–1.2)
CO2: 25 mmol/L (ref 20–29)
Calcium: 9.5 mg/dL (ref 8.7–10.3)
Chloride: 107 mmol/L — ABNORMAL HIGH (ref 96–106)
Creatinine, Ser: 0.79 mg/dL (ref 0.57–1.00)
Globulin, Total: 2.8 g/dL (ref 1.5–4.5)
Glucose: 113 mg/dL — ABNORMAL HIGH (ref 70–99)
Potassium: 4.8 mmol/L (ref 3.5–5.2)
Sodium: 145 mmol/L — ABNORMAL HIGH (ref 134–144)
Total Protein: 6.9 g/dL (ref 6.0–8.5)
eGFR: 81 mL/min/{1.73_m2} (ref >59)

## 2022-04-23 LAB — LIPID PANEL
Chol/HDL Ratio: 5.8 {ratio} — ABNORMAL HIGH (ref 0.0–4.4)
Cholesterol, Total: 248 mg/dL — ABNORMAL HIGH (ref 100–199)
HDL: 43 mg/dL (ref >39)
LDL Chol Calc (NIH): 170 mg/dL — ABNORMAL HIGH (ref 0–99)
Triglycerides: 189 mg/dL — ABNORMAL HIGH (ref 0–149)
VLDL Cholesterol Cal: 35 mg/dL (ref 5–40)

## 2022-04-23 LAB — HEMOGLOBIN A1C
Est. average glucose Bld gHb Est-mCnc: 128 mg/dL
Hgb A1c MFr Bld: 6.1 % — ABNORMAL HIGH (ref 4.8–5.6)

## 2022-04-23 LAB — VITAMIN D 25 HYDROXY (VIT D DEFICIENCY, FRACTURES): Vit D, 25-Hydroxy: 25.1 ng/mL — ABNORMAL LOW (ref 30.0–100.0)

## 2022-04-25 ENCOUNTER — Other Ambulatory Visit: Payer: Self-pay | Admitting: Physician Assistant

## 2022-04-25 DIAGNOSIS — E782 Mixed hyperlipidemia: Secondary | ICD-10-CM

## 2022-04-25 DIAGNOSIS — E559 Vitamin D deficiency, unspecified: Secondary | ICD-10-CM

## 2022-04-25 MED ORDER — ROSUVASTATIN CALCIUM 10 MG PO TABS: 10.0000 mg | ORAL_TABLET | Freq: Every day | ORAL | 3 refills | Status: DC

## 2022-04-25 MED ORDER — VITAMIN D (ERGOCALCIFEROL) 1.25 MG (50000 UNIT) PO CAPS: 50000.0000 [IU] | ORAL_CAPSULE | ORAL | 3 refills | Status: DC

## 2022-04-25 NOTE — Progress Notes (Signed)
I commented on her MyChart and please call patient to tell her -  ? ?Regarding your recent blood work: ? ?Liver and kidneys are fine ? ?Cholesterol levels are too high; I ordered a prescription for a statin medication for you to take once a day to help decrease your cholesterol levels called rosuvastatin or crestor 10 mg, 1 by mouth daily ? ?Blood sugar levels are a little high; continue to eat a low sugar diet, avoid starchy food with a lot of carbohydrates, avoid fried and processed foods ? ?Vitamin D is too low. I ordered a prescription for Vitamin D supplements to be taken once a week ? ?We can check her lab levels again at her appointment in 10/2022

## 2022-06-13 ENCOUNTER — Encounter: Payer: Self-pay | Admitting: Internal Medicine

## 2022-08-07 DIAGNOSIS — M5451 Vertebrogenic low back pain: Secondary | ICD-10-CM | POA: Diagnosis not present

## 2022-08-07 DIAGNOSIS — M5136 Other intervertebral disc degeneration, lumbar region: Secondary | ICD-10-CM | POA: Diagnosis not present

## 2022-08-20 ENCOUNTER — Encounter: Payer: Self-pay | Admitting: Internal Medicine

## 2022-09-03 ENCOUNTER — Telehealth (INDEPENDENT_AMBULATORY_CARE_PROVIDER_SITE_OTHER): Payer: Medicare HMO | Admitting: Family Medicine

## 2022-09-03 ENCOUNTER — Encounter: Payer: Self-pay | Admitting: Family Medicine

## 2022-09-03 VITALS — Temp 97.8°F | Ht 63.0 in | Wt 160.0 lb

## 2022-09-03 DIAGNOSIS — J029 Acute pharyngitis, unspecified: Secondary | ICD-10-CM | POA: Diagnosis not present

## 2022-09-03 DIAGNOSIS — R051 Acute cough: Secondary | ICD-10-CM | POA: Diagnosis not present

## 2022-09-03 NOTE — Patient Instructions (Signed)
Stay well hydrated. Consider using a decongestant such as sudafed if you have ongoing sinus pain or congestion. This will also help dry up any postnasal drainage that may be contributing to cough. I recommend using Guaifenesin (such as found in Mucinex or Robitussin). This is an expectorant that keeps the mucus thin, and should help with your symptoms. You may use tylenol as needed for pain. You can try salt water gargles as needed. You can try other things for sore throat, if needed, such as Choraseptic throat spray.  If you have persistently discolored mucus or phlegm, worsening cough, chest pain, shortness of breath or fever, please seek re-evaluation.

## 2022-09-03 NOTE — Progress Notes (Signed)
Start time: 12:43 End time: 12:57  Virtual Visit via Telephone Note  I connected with Tammy [name] on 09/03/22 by telephone, after being unable to connect with her over video; I verified that I am speaking with the correct person using two identifiers.  Location: Patient: home Provider: office   I discussed the limitations of evaluation and management by telemedicine and the availability of in person appointments. The patient expressed understanding and agreed to proceed.  History of Present Illness:  Chief Complaint  Patient presents with   Cough    VIRTUAL cough, ST and runny nose. No other symptoms that started Saturday. Home covid tests both neg-Sun and today.    She went to a funeral 9/15 (inside church, not at grave).  The next day she felt chills.  Now she has sore throat, runny nose, cough. Dry cough mostly.  She did cough up some yellow phlegm a couple of times.  Can't recall the color of the drainage, runny nose has improved. Currently complaining of sore throat and cough.  No fever, no further chills. She had a headache this morning, better now (without any medication). It was across her forehead.  No h/o allergies. Negative home COVID test on Sunday and again today.  PMH, PSH, SH reviewed  Outpatient Encounter Medications as of 09/03/2022  Medication Sig Note   escitalopram (LEXAPRO) 20 MG tablet Take 1 tablet (20 mg total) by mouth daily.    rosuvastatin (CRESTOR) 10 MG tablet Take 1 tablet (10 mg total) by mouth daily.    ALPRAZolam (XANAX) 0.5 MG tablet Take 1 tablet (0.5 mg total) by mouth at bedtime as needed for anxiety. (Patient not taking: Reported on 09/03/2022) 09/03/2022: prn   omeprazole (PRILOSEC) 40 MG capsule Take 1 capsule (40 mg total) by mouth daily. (Patient not taking: Reported on 09/03/2022) 09/03/2022: As needed   valACYclovir (VALTREX) 1000 MG tablet Take 2 tablets (2,000 mg) by mouth every 12 hours x 1 day at onset of symptoms. (Patient not  taking: Reported on 09/03/2022) 09/03/2022: prn   [DISCONTINUED] Vitamin D, Ergocalciferol, (DRISDOL) 1.25 MG (50000 UNIT) CAPS capsule Take 1 capsule (50,000 Units total) by mouth every 7 (seven) days.    No facility-administered encounter medications on file as of 09/03/2022.   No Known Allergies  ROS:  no fever, +headache, sore throat and cough per HPI.  Intermittent runny nose, not now. No n/v/d or other concerns.   Observations/Objective:  Temp 97.8 F (36.6 C) (Temporal)   Ht 5\' 3"  (1.6 m)   Wt 160 lb (72.6 kg)   BMI 28.34 kg/m   Patient is alert and oriented, with normal speech. Exam is limited due to the telephonic nature of the visit.   Assessment and Plan:  Sore throat - suspect related to PND, disc viral vs allergies, doubt bacterial.  f/u if persists/worsens  Acute cough - suspect related to PND. DIscussed guaifenesin, DM prn. contact us if has persistent purulent drainage or fever  Stay well hydrated. Consider using a decongestant such as sudafed if you have ongoing sinus pain or congestion. This will also help dry up any postnasal drainage that may be contributing to cough. I recommend using Guaifenesin (such as found in Mucinex or Robitussin). This is an expectorant that keeps the mucus thin, and should help with your symptoms. You may use tylenol as needed for pain. You can try salt water gargles as needed. You can try other things for sore throat, if needed, such as Choraseptic throat  spray.  If you have persistently discolored mucus or phlegm, worsening cough, chest pain, shortness of breath or fever, please seek re-evaluation.   Follow Up Instructions:    I discussed the assessment and treatment plan with the patient. The patient was provided an opportunity to ask questions and all were answered. The patient agreed with the plan and demonstrated an understanding of the instructions.   The patient was advised to call back or seek an in-person evaluation if  the symptoms worsen or if the condition fails to improve as anticipated.  I spent 15 minutes dedicated to the care of this patient, including pre-visit review of records, face to face time, post-visit ordering of testing and documentation.    [name], MD

## 2022-09-05 ENCOUNTER — Ambulatory Visit (HOSPITAL_COMMUNITY)
Admission: EM | Admit: 2022-09-05 | Discharge: 2022-09-05 | Disposition: A | Discharge status: Home / Self Care | Payer: Medicare HMO | Attending: Family Medicine | Admitting: Family Medicine

## 2022-09-05 ENCOUNTER — Encounter (HOSPITAL_COMMUNITY): Payer: Self-pay | Admitting: Emergency Medicine

## 2022-09-05 DIAGNOSIS — J069 Acute upper respiratory infection, unspecified: Secondary | ICD-10-CM

## 2022-09-05 DIAGNOSIS — R051 Acute cough: Secondary | ICD-10-CM

## 2022-09-05 HISTORY — DX: Anxiety disorder, unspecified: F41.9

## 2022-09-05 MED ORDER — BENZONATATE 100 MG PO CAPS: 100.0000 mg | ORAL_CAPSULE | Freq: Three times a day (TID) | ORAL | 0 refills | Status: DC | PRN

## 2022-09-05 NOTE — Discharge Instructions (Signed)
You were seen today for upper respiratory infection with cough.  Your exam was normal today.  I have sent out a cough medication for you to take up to three times/day.  If you have a worsening cough, or develop fever or shortness of breath then please return for further evaluation.

## 2022-09-05 NOTE — ED Triage Notes (Signed)
Pt c/o headache, cough that is non-productive and sore throat since Saturday. Hasnt taken any medications for symptoms.

## 2022-09-05 NOTE — ED Provider Notes (Signed)
[facility]    CSN: [id number] Arrival date & time: 09/05/22  0867      History   Chief Complaint Chief Complaint  Patient presents with   Sore Throat   Cough    HPI Tammy [name] is a 71 y.o. female.   Patient is here for 1 week of headache, cough, and sore throat.  Slight chills the 1st day, but not since.  No fevers.  Its the cough that is bothering her most.  No sputum.  No wheezing or sob noted.  Mild runny nose, congestion.  She did have 2 home covid tests that were negative.  She is not taking anything otc.        Past Medical History:  Diagnosis Date   Anxiety    COVID-19 virus infection 03/30/2020   Depression    GAD (generalized anxiety disorder)    Gastroesophageal reflux disease 05/05/2018   GERD (gastroesophageal reflux disease)    History of COVID-19    Prediabetes     Patient Active Problem List   Diagnosis Date Noted   Change in bowel habit 04/22/2022   Diverticulosis of colon 04/22/2022   Mixed hyperlipidemia 04/22/2022   Body mass index (BMI) of 29.0-29.9 in adult 04/22/2022   Herpes labialis 04/22/2022   Chronic bilateral low back pain without sciatica 04/22/2022   Vitamin D deficiency 06/11/2020   History of COVID-19 06/11/2020   Fatigue 03/30/2020   Hypokalemia 03/30/2020   GERD (gastroesophageal reflux disease)    Depression 03/23/2019   GAD (generalized anxiety disorder)    Prediabetes     Past Surgical History:  Procedure Laterality Date   CESAREAN SECTION      OB History   No obstetric history on file.      Home Medications    Prior to Admission medications   Medication Sig Start Date End Date Taking? Authorizing Provider  ALPRAZolam Prudy Feeler) 0.5 MG tablet Take 1 tablet (0.5 mg total) by mouth at bedtime as needed for anxiety. Patient not taking: Reported on 09/03/2022 04/22/22   Burnard Hawthorne B, PA-C  escitalopram (LEXAPRO) 20 MG tablet Take 1 tablet (20 mg total) by mouth daily. 04/22/22   Jake Shark, PA-C  omeprazole (PRILOSEC) 40 MG capsule Take 1 capsule (40 mg total) by mouth daily. Patient not taking: Reported on 09/03/2022 04/22/22   Burnard Hawthorne B, PA-C  rosuvastatin (CRESTOR) 10 MG tablet Take 1 tablet (10 mg total) by mouth daily. 04/25/22   Jake Shark, PA-C  valACYclovir (VALTREX) 1000 MG tablet Take 2 tablets (2,000 mg) by mouth every 12 hours x 1 day at onset of symptoms. Patient not taking: Reported on 09/03/2022 04/22/22   Lexine Baton    Family History No family history on file.  Social History Social History   Tobacco Use   Smoking status: Never   Smokeless tobacco: Never  Vaping Use   Vaping Use: Never used  Substance Use Topics   Alcohol use: No   Drug use: No     Allergies   Patient has no known allergies.   Review of Systems Review of Systems  Constitutional: Negative.   HENT: Negative.    Respiratory:  Positive for cough.   Cardiovascular: Negative.   Gastrointestinal: Negative.   Musculoskeletal: Negative.   Hematological: Negative.   Psychiatric/Behavioral: Negative.       Physical Exam Triage Vital Signs ED Triage Vitals  Enc Vitals Group     BP 09/05/22 0854 125/61  Pulse Rate 09/05/22 0854 67     Resp 09/05/22 0854 17     Temp 09/05/22 0854 (!) 97.5 F (36.4 C)     Temp Source 09/05/22 0854 Oral     SpO2 09/05/22 0854 99 %     Weight --      Height --      Head Circumference --      Peak Flow --      Pain Score 09/05/22 0853 8     Pain Loc --      Pain Edu? --      Excl. in [place]? --    No data found.  Updated Vital Signs BP 125/61 (BP Location: Right Arm)   Pulse 67   Temp (!) 97.5 F (36.4 C) (Oral)   Resp 17   SpO2 99%   Visual Acuity Right Eye Distance:   Left Eye Distance:   Bilateral Distance:    Right Eye Near:   Left Eye Near:    Bilateral Near:     Physical Exam Constitutional:      Appearance: She is well-developed.  HENT:     Head: Normocephalic.     Nose: No congestion  or rhinorrhea.     Mouth/Throat:     Mouth: Mucous membranes are moist.     Pharynx: No pharyngeal swelling or posterior oropharyngeal erythema.     Tonsils: No tonsillar exudate.  Cardiovascular:     Rate and Rhythm: Normal rate.     Heart sounds: Normal heart sounds.  Pulmonary:     Effort: Pulmonary effort is normal.  Musculoskeletal:     Cervical back: Normal range of motion and neck supple.  Lymphadenopathy:     Cervical: No cervical adenopathy.  Skin:    General: Skin is warm.  Neurological:     General: No focal deficit present.     Mental Status: She is alert.  Psychiatric:        Mood and Affect: Mood normal.      UC Treatments / Results  Labs (all labs ordered are listed, but only abnormal results are displayed) Labs Reviewed - No data to display  EKG   Radiology No results found.  Procedures Procedures (including critical care time)  Medications Ordered in UC Medications - No data to display  Initial Impression / Assessment and Plan / UC Course  I have reviewed the triage vital signs and the nursing notes.  Pertinent labs & imaging results that were available during my care of the patient were reviewed by me and considered in my medical decision making (see chart for details).   Final Clinical Impressions(s) / UC Diagnoses   Final diagnoses:  Upper respiratory tract infection, unspecified type  Acute cough     Discharge Instructions      You were seen today for upper respiratory infection with cough.  Your exam was normal today.  I have sent out a cough medication for you to take up to three times/day.  If you have a worsening cough, or develop fever or shortness of breath then please return for further evaluation.     ED Prescriptions     Medication Sig Dispense Auth. Provider   benzonatate (TESSALON PERLES) 100 MG capsule Take 1 capsule (100 mg total) by mouth 3 (three) times daily as needed for cough. 20 capsule Rondel Oh, MD       PDMP not reviewed this encounter.   Rondel Oh, MD 09/05/22 [phone]

## 2022-09-23 ENCOUNTER — Encounter: Payer: Self-pay | Admitting: Internal Medicine

## 2022-09-27 DIAGNOSIS — N3090 Cystitis, unspecified without hematuria: Secondary | ICD-10-CM | POA: Diagnosis not present

## 2022-09-27 DIAGNOSIS — N3000 Acute cystitis without hematuria: Secondary | ICD-10-CM | POA: Diagnosis not present

## 2022-10-28 ENCOUNTER — Ambulatory Visit: Payer: Medicare HMO | Admitting: Physician Assistant

## 2022-10-28 ENCOUNTER — Encounter: Payer: Self-pay | Admitting: Internal Medicine

## 2022-12-02 ENCOUNTER — Other Ambulatory Visit: Payer: Self-pay | Admitting: Physician Assistant

## 2022-12-02 DIAGNOSIS — F411 Generalized anxiety disorder: Secondary | ICD-10-CM

## 2022-12-03 NOTE — Telephone Encounter (Signed)
Refill request last apt 09/03/22 

## 2023-02-24 ENCOUNTER — Ambulatory Visit (INDEPENDENT_AMBULATORY_CARE_PROVIDER_SITE_OTHER): Payer: Medicare PPO

## 2023-02-24 VITALS — Ht 63.0 in | Wt 160.0 lb

## 2023-02-24 DIAGNOSIS — Z0000 Encounter for general adult medical examination without abnormal findings: Secondary | ICD-10-CM | POA: Diagnosis not present

## 2023-02-24 NOTE — Progress Notes (Signed)
I connected with  Tammy [name] on 02/24/23 by a audio enabled telemedicine application and verified that I am speaking with the correct person using two identifiers.  Patient Location: Home  Provider Location: Office/Clinic  I discussed the limitations of evaluation and management by telemedicine. The patient expressed understanding and agreed to proceed.  Subjective:   Tammy [name] is a 72 y.o. female who presents for Medicare Annual (Subsequent) preventive examination.  Review of Systems     Cardiac Risk Factors include: advanced age (>27mn, >>62women);dyslipidemia     Objective:    Today's Vitals   02/24/23 0956  Weight: 160 lb (72.6 kg)  Height: '5\' 3"'$  (1.6 m)   Body mass index is 28.34 kg/m.     02/24/2023   10:01 AM 02/21/2022   10:31 AM 03/19/2020   10:56 AM 03/14/2020    2:26 PM  Advanced Directives  Does Patient Have a Medical Advance Directive? No No No No    Current Medications (verified) Outpatient Encounter Medications as of 02/24/2023  Medication Sig   ALPRAZolam (XANAX) 0.5 MG tablet TAKE 1 TABLET BY MOUTH AT BEDTIME AS NEEDED FOR ANXIETY.   benzonatate (TESSALON PERLES) 100 MG capsule Take 1 capsule (100 mg total) by mouth 3 (three) times daily as needed for cough.   escitalopram (LEXAPRO) 20 MG tablet Take 1 tablet (20 mg total) by mouth daily.   omeprazole (PRILOSEC) 40 MG capsule Take 1 capsule (40 mg total) by mouth daily.   rosuvastatin (CRESTOR) 10 MG tablet Take 1 tablet (10 mg total) by mouth daily.   valACYclovir (VALTREX) 1000 MG tablet Take 2 tablets (2,000 mg) by mouth every 12 hours x 1 day at onset of symptoms.   No facility-administered encounter medications on file as of 02/24/2023.    Allergies (verified) Patient has no known allergies.   History: Past Medical History:  Diagnosis Date   Anxiety    Arthritis    Cataract    COVID-19 virus infection 03/30/2020   Depression    GAD (generalized anxiety disorder)    Gastroesophageal  reflux disease 05/05/2018   GERD (gastroesophageal reflux disease)    History of COVID-19    Prediabetes    Past Surgical History:  Procedure Laterality Date   CESAREAN SECTION     Family History  Problem Relation Age of Onset   Diabetes Brother    Social History   Socioeconomic History   Marital status: Widowed    Spouse name: Not on file   Number of children: Not on file   Years of education: Not on file   Highest education level: Not on file  Occupational History   Not on file  Tobacco Use   Smoking status: Never   Smokeless tobacco: Never  Vaping Use   Vaping Use: Never used  Substance and Sexual Activity   Alcohol use: No   Drug use: No   Sexual activity: Not Currently  Other Topics Concern   Not on file  Social History Narrative   Not on file   Social Determinants of Health   Financial Resource Strain: Low Risk  (02/24/2023)   Overall Financial Resource Strain (CARDIA)    Difficulty of Paying Living Expenses: Not hard at all  Food Insecurity: No Food Insecurity (02/24/2023)   Hunger Vital Sign    Worried About Running Out of Food in the Last Year: Never true    Ran Out of Food in the Last Year: Never true  Transportation Needs: No  Transportation Needs (02/24/2023)   PRAPARE - Hydrologist (Medical): No    Lack of Transportation (Non-Medical): No  Physical Activity: Inactive (02/24/2023)   Exercise Vital Sign    Days of Exercise per Week: 0 days    Minutes of Exercise per Session: 0 min  Stress: No Stress Concern Present (02/24/2023)   [place]    Feeling of Stress : Not at all  Social Connections: Not on file    Tobacco Counseling Counseling given: Not Answered   Clinical Intake:  Pre-visit preparation completed: Yes  Pain : No/denies pain     Nutritional Status: BMI 25 -29 Overweight Nutritional Risks: None Diabetes: No  How often do you need to  have someone help you when you read instructions, pamphlets, or other written materials from your doctor or pharmacy?: 1 - Never  Diabetic? no  Interpreter Needed?: No  Information entered by :: NAllen LPN   Activities of Daily Living    02/24/2023   10:01 AM  In your present state of health, do you have any difficulty performing the following activities:  Hearing? 0  Vision? 0  Difficulty concentrating or making decisions? 0  Walking or climbing stairs? 0  Dressing or bathing? 0  Doing errands, shopping? 0  Preparing Food and eating ? N  Using the Toilet? N  In the past six months, have you accidently leaked urine? Y  Comment with cough or hard sneeze  Do you have problems with loss of bowel control? N  Managing your Medications? N  Managing your Finances? N  Housekeeping or managing your Housekeeping? N    Patient Care Team: Marcellina Millin (Inactive) as PCP - General (Physician Assistant)  Indicate any recent Medical Services you may have received from other than [facility] providers in the past year (date may be approximate).     Assessment:   This is a routine wellness examination for Tammy [name].  Hearing/Vision screen Vision Screening - Comments:: Regular eye exams, [facility]  Dietary issues and exercise activities discussed: Current Exercise Habits: The patient does not participate in regular exercise at present   Goals Addressed             This Visit's Progress    Patient Stated       02/24/2023, no goals       Depression Screen    02/24/2023   10:01 AM 04/22/2022   10:37 AM 02/21/2022   10:31 AM 09/06/2021    9:52 AM 06/11/2020    1:36 PM 06/02/2019    9:41 AM 03/23/2019   10:54 AM  PHQ 2/9 Scores  PHQ - 2 Score 0 0 0 0 0 0 0    Fall Risk    02/24/2023   10:01 AM 04/22/2022   10:37 AM 02/21/2022   10:31 AM 09/06/2021    9:52 AM 06/11/2020    1:36 PM  Fall Risk   Falls in the past year? 0 0 0 0 0  Number falls in past yr: 0 0  0 0  Injury  with Fall? 0 0  0 0  Risk for fall due to : Medication side effect No Fall Risks Medication side effect No Fall Risks   Follow up Falls prevention discussed;Education provided;Falls evaluation completed Falls evaluation completed Falls evaluation completed;Education provided;Falls prevention discussed Falls evaluation completed     FALL RISK PREVENTION PERTAINING TO THE HOME:  Any stairs in  or around the home? Yes  If so, are there any without handrails? No  Home free of loose throw rugs in walkways, pet beds, electrical cords, etc? Yes  Adequate lighting in your home to reduce risk of falls? Yes   ASSISTIVE DEVICES UTILIZED TO PREVENT FALLS:  Life alert? No  Use of a cane, walker or w/c? No  Grab bars in the bathroom? No  Shower chair or bench in shower? No  Elevated toilet seat or a handicapped toilet? Yes   TIMED UP AND GO:  Was the test performed? No .      Cognitive Function:        02/24/2023   10:02 AM 02/21/2022   10:33 AM  6CIT Screen  What Year? 0 points 0 points  What month? 0 points 0 points  What time? 0 points 0 points  Count back from 20 0 points 0 points  Months in reverse 4 points 0 points  Repeat phrase 4 points 6 points  Total Score 8 points 6 points    Immunizations Immunization History  Administered Date(s) Administered   Fluad Quad(high Dose 65+) 09/29/2019   Pneumococcal Polysaccharide-23 06/02/2019    TDAP status: Due, Education has been provided regarding the importance of this vaccine. Advised may receive this vaccine at local pharmacy or Health Dept. Aware to provide a copy of the vaccination record if obtained from local pharmacy or Health Dept. Verbalized acceptance and understanding.  Flu Vaccine status: Due, Education has been provided regarding the importance of this vaccine. Advised may receive this vaccine at local pharmacy or Health Dept. Aware to provide a copy of the vaccination record if obtained from local pharmacy or Health  Dept. Verbalized acceptance and understanding.  Pneumococcal vaccine status: Up to date  Covid-19 vaccine status: Declined, Education has been provided regarding the importance of this vaccine but patient still declined. Advised may receive this vaccine at local pharmacy or Health Dept.or vaccine clinic. Aware to provide a copy of the vaccination record if obtained from local pharmacy or Health Dept. Verbalized acceptance and understanding.  Qualifies for Shingles Vaccine? Yes   Zostavax completed No   Shingrix Completed?: No.    Education has been provided regarding the importance of this vaccine. Patient has been advised to call insurance company to determine out of pocket expense if they have not yet received this vaccine. Advised may also receive vaccine at local pharmacy or Health Dept. Verbalized acceptance and understanding.  Screening Tests Health Maintenance  Topic Date Due   COVID-19 Vaccine (1) Never done   DTaP/Tdap/Td (1 - Tdap) Never done   Zoster Vaccines- Shingrix (1 of 2) Never done   Pneumonia Vaccine 55+ Years old (2 of 2 - PCV) 06/01/2020   INFLUENZA VACCINE  07/15/2022   Medicare Annual Wellness (AWV)  02/22/2023   COLONOSCOPY (Pts 45-54yr Insurance coverage will need to be confirmed)  10/01/2023   MAMMOGRAM  02/28/2024   DEXA SCAN  Completed   Hepatitis C Screening  Completed   HPV VACCINES  Aged Out    Health Maintenance  Health Maintenance Due  Topic Date Due   COVID-19 Vaccine (1) Never done   DTaP/Tdap/Td (1 - Tdap) Never done   Zoster Vaccines- Shingrix (1 of 2) Never done   Pneumonia Vaccine 71 Years old (2 of 2 - PCV) 06/01/2020   INFLUENZA VACCINE  07/15/2022   Medicare Annual Wellness (AWV)  02/22/2023    Colorectal cancer screening: Type of screening: Colonoscopy. Completed 09/30/2013. Repeat  every 10 years  Mammogram status: Completed 02/27/2022. Repeat every year  Bone Density status: Completed 01/03/2015.  Lung Cancer Screening: (Low Dose  CT Chest recommended if Age 72-80 years, 30 pack-year currently smoking OR have quit w/in 15years.) does not qualify.   Lung Cancer Screening Referral: no  Additional Screening:  Hepatitis C Screening: does qualify; Completed 06/02/2019  Vision Screening: Recommended annual ophthalmology exams for early detection of glaucoma and other disorders of the eye. Is the patient up to date with their annual eye exam?  Yes  Who is the provider or what is the name of the office in which the patient attends annual eye exams? [facility] If pt is not established with a provider, would they like to be referred to a provider to establish care? No .   Dental Screening: Recommended annual dental exams for proper oral hygiene  Community Resource Referral / Chronic Care Management: CRR required this visit?  No   CCM required this visit?  No      Plan:     I have personally reviewed and noted the following in the patient's chart:   Medical and social history Use of alcohol, tobacco or illicit drugs  Current medications and supplements including opioid prescriptions. Patient is not currently taking opioid prescriptions. Functional ability and status Nutritional status Physical activity Advanced directives List of other physicians Hospitalizations, surgeries, and ER visits in previous 12 months Vitals Screenings to include cognitive, depression, and falls Referrals and appointments  In addition, I have reviewed and discussed with patient certain preventive protocols, quality metrics, and best practice recommendations. A written personalized care plan for preventive services as well as general preventive health recommendations were provided to patient.     Kellie Simmering, LPN   [id number]   Nurse Notes: none  Due to this being a virtual visit, the after visit summary with patients personalized plan was offered to patient via mail or my-chart. Patient would like to access on  my-chart

## 2023-02-24 NOTE — Patient Instructions (Signed)
Tammy [name] , Thank you for taking time to come for your Medicare Wellness Visit. I appreciate your ongoing commitment to your health goals. Please review the following plan we discussed and let me know if I can assist you in the future.   These are the goals we discussed:  Goals      Patient Stated     02/21/2022, no goals     Patient Stated     02/24/2023, no goals        This is a list of the screening recommended for you and due dates:  Health Maintenance  Topic Date Due   COVID-19 Vaccine (1) Never done   DTaP/Tdap/Td vaccine (1 - Tdap) Never done   Zoster (Shingles) Vaccine (1 of 2) Never done   Pneumonia Vaccine (2 of 2 - PCV) 06/01/2020   Flu Shot  07/15/2022   Colon Cancer Screening  10/01/2023   Medicare Annual Wellness Visit  02/24/2024   Mammogram  02/28/2024   DEXA scan (bone density measurement)  Completed   Hepatitis C Screening: USPSTF Recommendation to screen - Ages 20-79 yo.  Completed   HPV Vaccine  Aged Out    Advanced directives: Advance directive discussed with you today.   Conditions/risks identified: none  Next appointment: Follow up in one year for your annual wellness visit    Preventive Care 65 Years and Older, Female Preventive care refers to lifestyle choices and visits with your health care provider that can promote health and wellness. What does preventive care include? A yearly physical exam. This is also called an annual well check. Dental exams once or twice a year. Routine eye exams. Ask your health care provider how often you should have your eyes checked. Personal lifestyle choices, including: Daily care of your teeth and gums. Regular physical activity. Eating a healthy diet. Avoiding tobacco and drug use. Limiting alcohol use. Practicing safe sex. Taking low-dose aspirin every day. Taking vitamin and mineral supplements as recommended by your health care provider. What happens during an annual well check? The services and  screenings done by your health care provider during your annual well check will depend on your age, overall health, lifestyle risk factors, and family history of disease. Counseling  Your health care provider may ask you questions about your: Alcohol use. Tobacco use. Drug use. Emotional well-being. Home and relationship well-being. Sexual activity. Eating habits. History of falls. Memory and ability to understand (cognition). Work and work Statistician. Reproductive health. Screening  You may have the following tests or measurements: Height, weight, and BMI. Blood pressure. Lipid and cholesterol levels. These may be checked every 5 years, or more frequently if you are over 27 years old. Skin check. Lung cancer screening. You may have this screening every year starting at age 3 if you have a 30-pack-year history of smoking and currently smoke or have quit within the past 15 years. Fecal occult blood test (FOBT) of the stool. You may have this test every year starting at age 24. Flexible sigmoidoscopy or colonoscopy. You may have a sigmoidoscopy every 5 years or a colonoscopy every 10 years starting at age 53. Hepatitis C blood test. Hepatitis B blood test. Sexually transmitted disease (STD) testing. Diabetes screening. This is done by checking your blood sugar (glucose) after you have not eaten for a while (fasting). You may have this done every 1-3 years. Bone density scan. This is done to screen for osteoporosis. You may have this done starting at age 57. Mammogram. This  may be done every 1-2 years. Talk to your health care provider about how often you should have regular mammograms. Talk with your health care provider about your test results, treatment options, and if necessary, the need for more tests. Vaccines  Your health care provider may recommend certain vaccines, such as: Influenza vaccine. This is recommended every year. Tetanus, diphtheria, and acellular pertussis (Tdap,  Td) vaccine. You may need a Td booster every 10 years. Zoster vaccine. You may need this after age 105. Pneumococcal 13-valent conjugate (PCV13) vaccine. One dose is recommended after age 63. Pneumococcal polysaccharide (PPSV23) vaccine. One dose is recommended after age 32. Talk to your health care provider about which screenings and vaccines you need and how often you need them. This information is not intended to replace advice given to you by your health care provider. Make sure you discuss any questions you have with your health care provider. Document Released: 12/28/2015 Document Revised: 08/20/2016 Document Reviewed: 10/02/2015 Elsevier Interactive Patient Education  2017 [employer] Prevention in the Home Falls can cause injuries. They can happen to people of all ages. There are many things you can do to make your home safe and to help prevent falls. What can I do on the outside of my home? Regularly fix the edges of walkways and driveways and fix any cracks. Remove anything that might make you trip as you walk through a door, such as a raised step or threshold. Trim any bushes or trees on the path to your home. Use bright outdoor lighting. Clear any walking paths of anything that might make someone trip, such as rocks or tools. Regularly check to see if handrails are loose or broken. Make sure that both sides of any steps have handrails. Any raised decks and porches should have guardrails on the edges. Have any leaves, snow, or ice cleared regularly. Use sand or salt on walking paths during winter. Clean up any spills in your garage right away. This includes oil or grease spills. What can I do in the bathroom? Use night lights. Install grab bars by the toilet and in the tub and shower. Do not use towel bars as grab bars. Use non-skid mats or decals in the tub or shower. If you need to sit down in the shower, use a plastic, non-slip stool. Keep the floor dry. Clean up any  water that spills on the floor as soon as it happens. Remove soap buildup in the tub or shower regularly. Attach bath mats securely with double-sided non-slip rug tape. Do not have throw rugs and other things on the floor that can make you trip. What can I do in the bedroom? Use night lights. Make sure that you have a light by your bed that is easy to reach. Do not use any sheets or blankets that are too big for your bed. They should not hang down onto the floor. Have a firm chair that has side arms. You can use this for support while you get dressed. Do not have throw rugs and other things on the floor that can make you trip. What can I do in the kitchen? Clean up any spills right away. Avoid walking on wet floors. Keep items that you use a lot in easy-to-reach places. If you need to reach something above you, use a strong step stool that has a grab bar. Keep electrical cords out of the way. Do not use floor polish or wax that makes floors slippery. If you must  use wax, use non-skid floor wax. Do not have throw rugs and other things on the floor that can make you trip. What can I do with my stairs? Do not leave any items on the stairs. Make sure that there are handrails on both sides of the stairs and use them. Fix handrails that are broken or loose. Make sure that handrails are as long as the stairways. Check any carpeting to make sure that it is firmly attached to the stairs. Fix any carpet that is loose or worn. Avoid having throw rugs at the top or bottom of the stairs. If you do have throw rugs, attach them to the floor with carpet tape. Make sure that you have a light switch at the top of the stairs and the bottom of the stairs. If you do not have them, ask someone to add them for you. What else can I do to help prevent falls? Wear shoes that: Do not have high heels. Have rubber bottoms. Are comfortable and fit you well. Are closed at the toe. Do not wear sandals. If you use a  stepladder: Make sure that it is fully opened. Do not climb a closed stepladder. Make sure that both sides of the stepladder are locked into place. Ask someone to hold it for you, if possible. Clearly mark and make sure that you can see: Any grab bars or handrails. First and last steps. Where the edge of each step is. Use tools that help you move around (mobility aids) if they are needed. These include: Canes. Walkers. Scooters. Crutches. Turn on the lights when you go into a dark area. Replace any light bulbs as soon as they burn out. Set up your furniture so you have a clear path. Avoid moving your furniture around. If any of your floors are uneven, fix them. If there are any pets around you, be aware of where they are. Review your medicines with your doctor. Some medicines can make you feel dizzy. This can increase your chance of falling. Ask your doctor what other things that you can do to help prevent falls. This information is not intended to replace advice given to you by your health care provider. Make sure you discuss any questions you have with your health care provider. Document Released: 09/27/2009 Document Revised: 05/08/2016 Document Reviewed: 01/05/2015 Elsevier Interactive Patient Education  2017 [employer].

## 2023-02-27 ENCOUNTER — Ambulatory Visit: Payer: Medicare HMO

## 2023-03-02 ENCOUNTER — Other Ambulatory Visit: Payer: Self-pay | Admitting: Physician Assistant

## 2023-03-02 NOTE — Telephone Encounter (Signed)
Refill request last at 04/22/22 did have [place] with [facility] 02/24/23.

## 2023-03-24 ENCOUNTER — Other Ambulatory Visit: Payer: Self-pay | Admitting: Physician Assistant

## 2023-04-29 DIAGNOSIS — H02831 Dermatochalasis of right upper eyelid: Secondary | ICD-10-CM | POA: Diagnosis not present

## 2023-04-29 DIAGNOSIS — H43812 Vitreous degeneration, left eye: Secondary | ICD-10-CM | POA: Diagnosis not present

## 2023-04-29 DIAGNOSIS — Z0100 Encounter for examination of eyes and vision without abnormal findings: Secondary | ICD-10-CM | POA: Diagnosis not present

## 2023-04-29 DIAGNOSIS — H5015 Alternating exotropia: Secondary | ICD-10-CM | POA: Diagnosis not present

## 2023-04-29 DIAGNOSIS — H2513 Age-related nuclear cataract, bilateral: Secondary | ICD-10-CM | POA: Diagnosis not present

## 2023-05-31 ENCOUNTER — Other Ambulatory Visit: Payer: Self-pay | Admitting: Physician Assistant

## 2023-06-05 ENCOUNTER — Encounter: Payer: Medicare PPO | Admitting: Nurse Practitioner

## 2023-06-12 ENCOUNTER — Encounter: Payer: Self-pay | Admitting: Nurse Practitioner

## 2023-06-12 ENCOUNTER — Ambulatory Visit: Payer: Medicare PPO | Admitting: Nurse Practitioner

## 2023-06-12 VITALS — BP 122/80 | HR 76 | Wt 156.8 lb

## 2023-06-12 DIAGNOSIS — E559 Vitamin D deficiency, unspecified: Secondary | ICD-10-CM | POA: Diagnosis not present

## 2023-06-12 DIAGNOSIS — R7303 Prediabetes: Secondary | ICD-10-CM | POA: Diagnosis not present

## 2023-06-12 DIAGNOSIS — E876 Hypokalemia: Secondary | ICD-10-CM

## 2023-06-12 DIAGNOSIS — Z6829 Body mass index (BMI) 29.0-29.9, adult: Secondary | ICD-10-CM

## 2023-06-12 DIAGNOSIS — K219 Gastro-esophageal reflux disease without esophagitis: Secondary | ICD-10-CM | POA: Diagnosis not present

## 2023-06-12 DIAGNOSIS — E782 Mixed hyperlipidemia: Secondary | ICD-10-CM

## 2023-06-12 DIAGNOSIS — F411 Generalized anxiety disorder: Secondary | ICD-10-CM

## 2023-06-12 DIAGNOSIS — B372 Candidiasis of skin and nail: Secondary | ICD-10-CM

## 2023-06-12 DIAGNOSIS — F32A Depression, unspecified: Secondary | ICD-10-CM

## 2023-06-12 MED ORDER — OMEPRAZOLE 40 MG PO CPDR: 40.0000 mg | DELAYED_RELEASE_CAPSULE | Freq: Every day | ORAL | 0 refills | Status: DC

## 2023-06-12 MED ORDER — ESCITALOPRAM OXALATE 20 MG PO TABS: 20.0000 mg | ORAL_TABLET | Freq: Every day | ORAL | 1 refills | Status: DC

## 2023-06-12 MED ORDER — ROSUVASTATIN CALCIUM 10 MG PO TABS: 10.0000 mg | ORAL_TABLET | Freq: Every day | ORAL | 0 refills | Status: DC

## 2023-06-12 NOTE — Progress Notes (Signed)
Shawna Clamp, DNP, AGNP-c [facility]  [address] [place], [place] [address] [phone]  ESTABLISHED PATIENT- Chronic Health and/or Follow-Up Visit  Blood pressure 122/80, pulse 76, weight 156 lb 12.8 oz (71.1 kg).    Tammy [name] is a 72 y.o. year old female presenting today for evaluation and management of chronic conditions.   Tammy [name] presents today for a follow-up visit. She reports having dental work done recently, including the placement of screws to hold dentures in place. However, she denies any significant pain or swelling related to the dental work.   Tammy [name] states that her blood pressure is currently well-controlled on amlodipine. She reports that she has been out of her medication for a little while due to insurance issues with the pharmacy, She denies experiencing any headaches, vision changes, chest pain, or shortness of breath. She would like to restart on the medication.   Tammy [name] has a history of sciatic nerve irritation and continues to experience discomfort from it. She has recently started a part-time job that involves more walking, which she hopes will help alleviate the issue.   Her gastroesophageal reflux disease (GERD) is well-managed with daily Nexium.   She reports a recurrent rash in the groin area and under her stomach, which she attributes to heat.   Tammy [name] also mentions occasional knee pain, which she suspects may be due to arthritis.  Additionally, Tammy [name] is taking vitamin D supplements as previously recommended due to a history of low vitamin D levels. Her mood is well controlled.    All ROS negative with exception of what is listed above.   PHYSICAL EXAM Physical Exam Vitals and nursing note reviewed.  Constitutional:      General: She is not in acute distress.    Appearance: Normal appearance. She is not ill-appearing.  Eyes:     Conjunctiva/sclera: Conjunctivae normal.  Neck:     Vascular: No carotid bruit.   Cardiovascular:     Rate and Rhythm: Normal rate and regular rhythm.     Pulses: Normal pulses.     Heart sounds: Normal heart sounds.  Pulmonary:     Effort: Pulmonary effort is normal.     Breath sounds: Normal breath sounds.  Abdominal:     General: Bowel sounds are normal.     Palpations: Abdomen is soft.  Musculoskeletal:        General: Normal range of motion.     Right lower leg: No edema.     Left lower leg: No edema.  Skin:    General: Skin is warm and dry.     Capillary Refill: Capillary refill takes less than 2 seconds.     Findings: Rash present.     Comments: Erythematous rash consistent with intertrigo, likely candidal, present on the abdomen.   Neurological:     Mental Status: She is alert and oriented to person, place, and time.  Psychiatric:        Mood and Affect: Mood normal.        Behavior: Behavior normal.     PLAN Problem List Items Addressed This Visit     GAD (generalized anxiety disorder)    Mood stable with no concerning symptoms present. Managed with escitalopram and PRN alprazolam. No alarm symptoms.  Plan: - Continue current management - Refills provided on escitalopram today. No refills needed on alprazolam - F/U if symptoms begin to worsen      Relevant Medications   escitalopram (LEXAPRO) 20 MG tablet   Prediabetes -  Primary    Managed with diet and exercise. Labs pending.  Plan: - Continue to watch your carbohydrate intake and keep your protein and fiber levels high.       Relevant Medications   escitalopram (LEXAPRO) 20 MG tablet   rosuvastatin (CRESTOR) 10 MG tablet   omeprazole (PRILOSEC) 40 MG capsule   Other Relevant Orders   CBC with Differential/Platelet (Completed)   CMP14+EGFR (Completed)   Hemoglobin A1c (Completed)   Depression    Mood stable with no concerning symptoms present. Managed with escitalopram and PRN alprazolam. No alarm symptoms. Plan:  - Continue current management  - Refills provided on  escitalopram today. No refills needed on alprazolam  - F/U if symptoms begin to worsen       Relevant Medications   escitalopram (LEXAPRO) 20 MG tablet   GERD (gastroesophageal reflux disease)    Currently stable on omeprazole daily with no alarm symptoms.  Plan: - continue current treatment      Relevant Medications   escitalopram (LEXAPRO) 20 MG tablet   rosuvastatin (CRESTOR) 10 MG tablet   omeprazole (PRILOSEC) 40 MG capsule   Other Relevant Orders   CBC with Differential/Platelet (Completed)   CMP14+EGFR (Completed)   Hemoglobin A1c (Completed)   Vitamin D deficiency    Managed with vitamin D supplementation OTC.  Plan: - Continue supplements - Monitor labs today      Relevant Medications   escitalopram (LEXAPRO) 20 MG tablet   rosuvastatin (CRESTOR) 10 MG tablet   omeprazole (PRILOSEC) 40 MG capsule   Other Relevant Orders   CBC with Differential/Platelet (Completed)   CMP14+EGFR (Completed)   Hemoglobin A1c (Completed)   Mixed hyperlipidemia    Currently managed with rosuvastatin. Tolerating medication well.  Plan: - Labs today - Continue current medication. - Refills provided today      Relevant Medications   rosuvastatin (CRESTOR) 10 MG tablet   Other Relevant Orders   Lipid panel (Completed)   Body mass index (BMI) of 29.0-29.9 in adult    Weight is stable at this time with no alarm symptoms.  Plan: - Routine management with diet and exercise recommended.       Candidal intertrigo    Symptoms consistent with candidal infection.  Plan: - Diflucan and topical nystatin and mometasone       Relevant Medications   fluconazole (DIFLUCAN) 150 MG tablet   nystatin cream (MYCOSTATIN)   mometasone (ELOCON) 0.1 % cream   Hypokalemia    Return in about 6 months (around 12/12/2023) for Med Management 30.  Time: 31 minutes, >50% spent counseling, care coordination, chart review, and documentation.   Shawna Clamp, DNP, AGNP-c

## 2023-06-13 LAB — LIPID PANEL
Chol/HDL Ratio: 3.9 {ratio} (ref 0.0–4.4)
Cholesterol, Total: 157 mg/dL (ref 100–199)
HDL: 40 mg/dL (ref >39)
LDL Chol Calc (NIH): 92 mg/dL (ref 0–99)
Triglycerides: 139 mg/dL (ref 0–149)
VLDL Cholesterol Cal: 25 mg/dL (ref 5–40)

## 2023-06-13 LAB — CBC WITH DIFFERENTIAL/PLATELET
Basophils Absolute: 0 10*3/uL (ref 0.0–0.2)
Basos: 1 %
EOS (ABSOLUTE): 0.1 10*3/uL (ref 0.0–0.4)
Eos: 2 %
Hematocrit: 38.3 % (ref 34.0–46.6)
Hemoglobin: 12.7 g/dL (ref 11.1–15.9)
Immature Grans (Abs): 0 10*3/uL (ref 0.0–0.1)
Immature Granulocytes: 0 %
Lymphocytes Absolute: 3.1 10*3/uL (ref 0.7–3.1)
Lymphs: 46 %
MCH: 31.1 pg (ref 26.6–33.0)
MCHC: 33.2 g/dL (ref 31.5–35.7)
MCV: 94 fL (ref 79–97)
Monocytes Absolute: 0.4 10*3/uL (ref 0.1–0.9)
Monocytes: 6 %
Neutrophils Absolute: 2.9 10*3/uL (ref 1.4–7.0)
Neutrophils: 45 %
Platelets: 363 10*3/uL (ref 150–450)
RBC: 4.08 x10E6/uL (ref 3.77–5.28)
RDW: 12.5 % (ref 11.7–15.4)
WBC: 6.6 10*3/uL (ref 3.4–10.8)

## 2023-06-13 LAB — CMP14+EGFR
ALT: 12 [IU]/L (ref 0–32)
AST: 17 [IU]/L (ref 0–40)
Albumin: 4 g/dL (ref 3.9–4.9)
Alkaline Phosphatase: 69 [IU]/L (ref 44–121)
BUN/Creatinine Ratio: 17 (ref 12–28)
BUN: 13 mg/dL (ref 8–27)
Bilirubin Total: 0.5 mg/dL (ref 0.0–1.2)
CO2: 26 mmol/L (ref 20–29)
Calcium: 9.2 mg/dL (ref 8.7–10.3)
Chloride: 104 mmol/L (ref 96–106)
Creatinine, Ser: 0.78 mg/dL (ref 0.57–1.00)
Globulin, Total: 2.5 g/dL (ref 1.5–4.5)
Glucose: 102 mg/dL — ABNORMAL HIGH (ref 70–99)
Potassium: 4.4 mmol/L (ref 3.5–5.2)
Sodium: 141 mmol/L (ref 134–144)
Total Protein: 6.5 g/dL (ref 6.0–8.5)
eGFR: 82 mL/min/{1.73_m2} (ref >59)

## 2023-06-13 LAB — HEMOGLOBIN A1C
Est. average glucose Bld gHb Est-mCnc: 128 mg/dL
Hgb A1c MFr Bld: 6.1 % — ABNORMAL HIGH (ref 4.8–5.6)

## 2023-07-26 ENCOUNTER — Encounter: Payer: Self-pay | Admitting: Nurse Practitioner

## 2023-07-26 DIAGNOSIS — B372 Candidiasis of skin and nail: Secondary | ICD-10-CM | POA: Insufficient documentation

## 2023-07-26 MED ORDER — MOMETASONE FUROATE 0.1 % EX CREA
TOPICAL_CREAM | CUTANEOUS | 1 refills | Status: DC
Start: 2023-07-26 — End: 2023-09-10

## 2023-07-26 MED ORDER — NYSTATIN 100000 UNIT/GM EX CREA
1.0000 | TOPICAL_CREAM | Freq: Two times a day (BID) | CUTANEOUS | 2 refills | Status: DC
Start: 2023-07-26 — End: 2023-09-10

## 2023-07-26 MED ORDER — FLUCONAZOLE 150 MG PO TABS
150.0000 mg | ORAL_TABLET | Freq: Once | ORAL | 2 refills | Status: AC
Start: 2023-07-26 — End: 2023-07-26

## 2023-07-26 NOTE — Assessment & Plan Note (Signed)
Managed with diet and exercise. Labs pending.  Plan: - Continue to watch your carbohydrate intake and keep your protein and fiber levels high.

## 2023-07-26 NOTE — Assessment & Plan Note (Signed)
Mood stable with no concerning symptoms present. Managed with escitalopram and PRN alprazolam. No alarm symptoms.  Plan: - Continue current management - Refills provided on escitalopram today. No refills needed on alprazolam - F/U if symptoms begin to worsen

## 2023-07-26 NOTE — Assessment & Plan Note (Signed)
Symptoms consistent with candidal infection.  Plan: - Diflucan and topical nystatin and mometasone

## 2023-07-26 NOTE — Assessment & Plan Note (Signed)
Managed with vitamin D supplementation OTC.  Plan: - Continue supplements - Monitor labs today

## 2023-07-26 NOTE — Assessment & Plan Note (Signed)
Currently managed with rosuvastatin. Tolerating medication well.  Plan: - Labs today - Continue current medication. - Refills provided today

## 2023-07-26 NOTE — Assessment & Plan Note (Signed)
Weight is stable at this time with no alarm symptoms.  Plan: - Routine management with diet and exercise recommended.

## 2023-07-26 NOTE — Assessment & Plan Note (Signed)
Currently stable on omeprazole daily with no alarm symptoms.  Plan: - continue current treatment

## 2023-08-05 ENCOUNTER — Other Ambulatory Visit: Payer: Self-pay | Admitting: Nurse Practitioner

## 2023-08-05 DIAGNOSIS — F411 Generalized anxiety disorder: Secondary | ICD-10-CM

## 2023-08-06 NOTE — Telephone Encounter (Signed)
Last apt 06/12/23.

## 2023-09-07 ENCOUNTER — Other Ambulatory Visit: Payer: Self-pay | Admitting: Nurse Practitioner

## 2023-09-07 DIAGNOSIS — E559 Vitamin D deficiency, unspecified: Secondary | ICD-10-CM

## 2023-09-07 DIAGNOSIS — K219 Gastro-esophageal reflux disease without esophagitis: Secondary | ICD-10-CM

## 2023-09-07 DIAGNOSIS — R7303 Prediabetes: Secondary | ICD-10-CM

## 2023-09-10 ENCOUNTER — Ambulatory Visit (INDEPENDENT_AMBULATORY_CARE_PROVIDER_SITE_OTHER): Payer: Medicare PPO | Admitting: Family Medicine

## 2023-09-10 ENCOUNTER — Encounter: Payer: Self-pay | Admitting: Family Medicine

## 2023-09-10 VITALS — BP 110/70 | HR 80 | Temp 98.0°F | Ht 63.0 in | Wt 152.0 lb

## 2023-09-10 DIAGNOSIS — R350 Frequency of micturition: Secondary | ICD-10-CM | POA: Diagnosis not present

## 2023-09-10 DIAGNOSIS — R8290 Unspecified abnormal findings in urine: Secondary | ICD-10-CM

## 2023-09-10 DIAGNOSIS — R531 Weakness: Secondary | ICD-10-CM

## 2023-09-10 DIAGNOSIS — M545 Low back pain, unspecified: Secondary | ICD-10-CM | POA: Diagnosis not present

## 2023-09-10 LAB — POCT URINALYSIS DIP (PROADVANTAGE DEVICE)
Blood, UA: NEGATIVE
Glucose, UA: NEGATIVE mg/dL
Ketones, POC UA: NEGATIVE mg/dL
Nitrite, UA: NEGATIVE
Protein Ur, POC: NEGATIVE mg/dL
Specific Gravity, Urine: 1.02
Urobilinogen, Ur: 0.2
pH, UA: 6 (ref 5.0–8.0)

## 2023-09-10 LAB — POCT INFLUENZA A/B
Influenza A, POC: NEGATIVE
Influenza B, POC: NEGATIVE

## 2023-09-10 LAB — POC COVID19 BINAXNOW: SARS Coronavirus 2 Ag: NEGATIVE

## 2023-09-10 NOTE — Patient Instructions (Signed)
Please drink more fluids--based on your urine, you are likely slightly dehydrated. We are sending the urine for culture due to having some leukocytes (white blood cells). Your symptoms don't sound like a classic urinary infection. If your culture shows an infection, we will send in an antibiotic to your pharmacy.

## 2023-09-10 NOTE — Progress Notes (Signed)
Chief Complaint  Patient presents with   Fatigue    Got sick Monday and felt nauseated. Weakness in her legs started Monday and she had some sweating. Left sided lower pain and would like her urine checked, mentioned some frequency.   9/23 she felt very nausated, didn't vomit.  Legs felt weak after that. Denies true lower extremity focal weakness, feels more like a generalized weakness/fatigue. Never had vomiting or diarrhea.  Didn't feel like reflux. Nausea lasted an hour or so.  Eating/drinking normally since then. She developed a fever blister that day as well.  Legs aren't as weak, has improved some, but "I just can't do anything"--not her normal self. The weakness/exhaustion feels similar to when she had COVID--same symptoms, including nausea, fatigue and weak legs.  Hasn't taken a COVID test this week.  She had some low back discomfort yesterday, a little better now.  She has some urinary frequency. H/o UTI's in the past. She isn't having dysuria like with prior UTI's, feels different.   PMH, PSH, SH reviewed  Recent labs reviewed from 05/2023--cbc, c-met, A1c  6.1  Outpatient Encounter Medications as of 09/10/2023  Medication Sig Note   escitalopram (LEXAPRO) 20 MG tablet Take 1 tablet (20 mg total) by mouth daily.    omeprazole (PRILOSEC) 40 MG capsule TAKE 1 CAPSULE (40 MG TOTAL) BY MOUTH DAILY.    rosuvastatin (CRESTOR) 10 MG tablet Take 1 tablet (10 mg total) by mouth daily.    valACYclovir (VALTREX) 1000 MG tablet Take 2 tablets (2,000 mg) by mouth every 12 hours x 1 day at onset of symptoms. 09/10/2023: As needed   ALPRAZolam (XANAX) 0.5 MG tablet TAKE 1 TABLET BY MOUTH AT BEDTIME AS NEEDED FOR ANXIETY (Patient not taking: Reported on 09/10/2023) 09/10/2023: As needed   [DISCONTINUED] mometasone (ELOCON) 0.1 % cream Apply with equal amount of nystatin cream to rash on the abdomen twice a day until rash resolves.    [DISCONTINUED] nystatin cream (MYCOSTATIN) Apply 1 Application  topically 2 (two) times daily. Mix with mometasone cream and apply in a thin layer for rash.    No facility-administered encounter medications on file as of 09/10/2023.   No Known Allergies  ROS: no fever or chills.  Nausea has resolved. No v/d.  Some urinary frequency, no dysuria. Some LBP but no flank pain.  Denies vaginal discharge. No HA, dizziness, chest pain or shortness of breath. No URI symptoms. See HPI    PHYSICAL EXAM:  BP 110/70   Pulse 80   Temp 98 F (36.7 C) (Tympanic)   Ht 5\' 3"  (1.6 m)   Wt 152 lb (68.9 kg)   BMI 26.93 kg/m   Wt Readings from Last 3 Encounters:  09/10/23 152 lb (68.9 kg)  06/12/23 156 lb 12.8 oz (71.1 kg)  02/24/23 160 lb (72.6 kg)   Pleasant, well-appearing female in no distress HEENT: conjunctiva and sclera are clear, EOMI. TM's and EAC's normal.  OP is clear, lips are dry, MM slightly dry. Healing cold sore below lower lip Neck: no lymphadenopathy, thyromegaly or mass Heart: regular rate and rhythm Lungs: clear bilaterally Back: no spinal or CVA tenderness. Area of discomfort is across the lower lumbar spine (bilat), nontener.  Abdomen: soft, nontender, no suprapubic tenderness, no mass Psych: normal mood, affect, hygiene and grooming Neuro: alert and oriented, cranial nerves intact. Normal strength in LE's, 2+ DTR's in LE's, symmetric, normal gait. Skin: normal turgor,no rash.   Urine: dark yellow, SG 1.020, negative ketones, blood, small  bili. Moderate leuks, negative nitrite  COVID and influenza tests negative   ASSESSMENT/PLAN:   Generalized weakness - improving; given that her sx of nausea and weakness are the same as w/prior COVID, will test.  Encouraged hydration  Abnormal urinalysis - minimally symptomatic, not well hydrated. Will send for culture and treat if +. Encouraged increased fluid intake - Plan: Urine Culture  Acute bilateral low back pain without sciatica - normal exam, nothing to suggest kidney or MSK  etiology, reassured. Tylenol prn - Plan: POCT Urinalysis DIP (Proadvantage Device), POC COVID-19, Influenza A/B  Urinary frequency - Plan: POCT Urinalysis DIP (Proadvantage Device), Urine Culture   COVID testing discussed--if +, she declines paxlovid, d/t prior side effects and very mild symptoms. [facility]

## 2023-09-14 LAB — URINE CULTURE

## 2023-09-14 MED ORDER — NITROFURANTOIN MONOHYD MACRO 100 MG PO CAPS: 100.0000 mg | ORAL_CAPSULE | Freq: Two times a day (BID) | ORAL | 0 refills | Status: DC

## 2023-09-14 NOTE — Addendum Note (Signed)
Addended by: Joselyn Arrow on: 09/14/2023 07:34 PM   Modules accepted: Orders

## 2023-12-04 ENCOUNTER — Ambulatory Visit
Admission: EM | Admit: 2023-12-04 | Discharge: 2023-12-04 | Disposition: A | Discharge status: Home / Self Care | Payer: Medicare PPO | Attending: Nurse Practitioner | Admitting: Nurse Practitioner

## 2023-12-04 DIAGNOSIS — J069 Acute upper respiratory infection, unspecified: Secondary | ICD-10-CM | POA: Diagnosis not present

## 2023-12-04 DIAGNOSIS — Z1152 Encounter for screening for COVID-19: Secondary | ICD-10-CM | POA: Insufficient documentation

## 2023-12-04 HISTORY — DX: Gastro-esophageal reflux disease without esophagitis: K21.9

## 2023-12-04 HISTORY — DX: Depression, unspecified: F32.A

## 2023-12-04 MED ORDER — BENZONATATE 100 MG PO CAPS
100.0000 mg | ORAL_CAPSULE | Freq: Three times a day (TID) | ORAL | 0 refills | Status: DC
Start: 2023-12-04 — End: 2024-03-08

## 2023-12-04 MED ORDER — FLUTICASONE PROPIONATE 50 MCG/ACT NA SUSP: 2.0000 | Freq: Every day | NASAL | 0 refills | Status: DC

## 2023-12-04 MED ORDER — CETIRIZINE HCL 10 MG PO TABS: 10.0000 mg | ORAL_TABLET | Freq: Every day | ORAL | 0 refills | Status: DC

## 2023-12-04 NOTE — ED Triage Notes (Signed)
Pt states sinus headache,cough and generalized weakness since yesterday.  States she has been taking alka seltzer plus cold medicine at home with some relief.

## 2023-12-04 NOTE — Discharge Instructions (Addendum)
COVID test is pending.  You will be contacted if the pending test result is abnormal.  You also have access to results via MyChart. Take medication as prescribed. Increase fluids and allow for plenty of rest. May take over-the-counter Tylenol as needed for pain, fever, or general discomfort. Normal saline nasal spray throughout the day for nasal congestion and runny nose. Recommend use of a humidifier in your bedroom at nighttime during sleep and sleeping slightly elevated on pillows while cough symptoms persist. If symptoms do not improve with this treatment, you may follow-up in this clinic or with your primary care physician for further evaluation. Follow-up as needed.

## 2023-12-04 NOTE — ED Provider Notes (Signed)
[facility]    CSN: [id number] Arrival date & time: 12/04/23  1138      History   Chief Complaint Chief Complaint  Patient presents with   Headache    HPI Tammy [name] is a 72 y.o. female.   The history is provided by the patient.   Patient presents with a 1 day history of sinus headache, cough, and generalized fatigue.  Patient denies fever, chills, ear pain, ear drainage, wheezing, difficulty breathing, chest pain, abdominal pain, nausea, vomiting, diarrhea, or rash.  Patient denies any obvious known sick contacts.  Reports she has been taking over-the-counter Alka-Seltzer for her symptoms.  Past Medical History:  Diagnosis Date   Depression    GERD (gastroesophageal reflux disease)     There are no active problems to display for this patient.   History reviewed. No pertinent surgical history.  OB History   No obstetric history on file.      Home Medications    Prior to Admission medications   Medication Sig Start Date End Date Taking? Authorizing Provider  escitalopram (LEXAPRO) 20 MG tablet Take 20 mg by mouth daily.   Yes [provider]  omeprazole (PRILOSEC) 20 MG capsule Take 20 mg by mouth daily.   Yes [provider]    Family History History reviewed. No pertinent family history.  Social History Social History   Tobacco Use   Smoking status: Never    Passive exposure: Never   Smokeless tobacco: Never  Vaping Use   Vaping status: Never Used  Substance Use Topics   Alcohol use: Not Currently   Drug use: Never     Allergies   Patient has no known allergies.   Review of Systems Review of Systems Per HPI  Physical Exam Triage Vital Signs ED Triage Vitals  Encounter Vitals Group     BP 12/04/23 1150 109/72     Systolic BP Percentile --      Diastolic BP Percentile --      Pulse Rate 12/04/23 1150 99     Resp 12/04/23 1150 16     Temp 12/04/23 1150 98.3 F (36.8 C)     Temp Source 12/04/23 1150 Oral      SpO2 12/04/23 1150 95 %     Weight 12/04/23 1151 150 lb (68 kg)     Height 12/04/23 1151 5\' 3"  (1.6 m)     Head Circumference --      Peak Flow --      Pain Score 12/04/23 1151 6     Pain Loc --      Pain Education --      Exclude from Growth Chart --    No data found.  Updated Vital Signs BP 109/72 (BP Location: Right Arm)   Pulse 99   Temp 98.3 F (36.8 C) (Oral)   Resp 16   Ht 5\' 3"  (1.6 m)   Wt 150 lb (68 kg)   SpO2 95%   BMI 26.57 kg/m   Visual Acuity Right Eye Distance:   Left Eye Distance:   Bilateral Distance:    Right Eye Near:   Left Eye Near:    Bilateral Near:     Physical Exam Vitals and nursing note reviewed.  Constitutional:      General: She is not in acute distress.    Appearance: She is well-developed.  HENT:     Head: Normocephalic.     Right Ear: Tympanic membrane, ear canal and external ear  normal.     Left Ear: Tympanic membrane, ear canal and external ear normal.     Nose: Congestion present.     Right Turbinates: Enlarged and swollen.     Left Turbinates: Enlarged and swollen.     Right Sinus: No maxillary sinus tenderness or frontal sinus tenderness.     Left Sinus: No maxillary sinus tenderness or frontal sinus tenderness.     Mouth/Throat:     Lips: Pink.     Mouth: Mucous membranes are moist.     Pharynx: Uvula midline. Postnasal drip present. No pharyngeal swelling, oropharyngeal exudate, posterior oropharyngeal erythema or uvula swelling.     Comments: Cobblestoning present to posterior oropharynx  Eyes:     Extraocular Movements: Extraocular movements intact.     Conjunctiva/sclera: Conjunctivae normal.     Pupils: Pupils are equal, round, and reactive to light.  Cardiovascular:     Rate and Rhythm: Normal rate and regular rhythm.     Pulses: Normal pulses.     Heart sounds: Normal heart sounds.  Pulmonary:     Effort: Pulmonary effort is normal. No respiratory distress.     Breath sounds: Normal breath sounds. No  stridor. No wheezing, rhonchi or rales.  Abdominal:     General: Bowel sounds are normal.     Palpations: Abdomen is soft.     Tenderness: There is no abdominal tenderness.  Musculoskeletal:     Cervical back: Normal range of motion.  Lymphadenopathy:     Cervical: No cervical adenopathy.  Skin:    General: Skin is warm and dry.  Neurological:     General: No focal deficit present.     Mental Status: She is alert and oriented to person, place, and time.  Psychiatric:        Mood and Affect: Mood normal.        Behavior: Behavior normal.      UC Treatments / Results  Labs (all labs ordered are listed, but only abnormal results are displayed) Labs Reviewed - No data to display  EKG   Radiology No results found.  Procedures Procedures (including critical care time)  Medications Ordered in UC Medications - No data to display  Initial Impression / Assessment and Plan / UC Course  I have reviewed the triage vital signs and the nursing notes.  Pertinent labs & imaging results that were available during my care of the patient were reviewed by me and considered in my medical decision making (see chart for details).  COVID test is pending.  Patient is able to receive Paxlovid if her COVID test is positive.  Do suspect a viral upper respiratory infection.  Will provide symptomatic treatment with Tessalon Perles for the cough, fluticasone 50 micro nasal spray for nasal congestion, and cetirizine 10 mg for nasal congestion and postnasal drainage.  Supportive care recommendations were provided and discussed with the patient to include fluids, rest, normal saline nasal spray, and use of a humidifier at bedtime during sleep.  Discussed indications with the patient regarding when follow-up be necessary.  Patient was in agreement with this plan of care and verbalized understanding.  All questions were answered.  Patient stable for discharge.  Final Clinical Impressions(s) / UC Diagnoses    Final diagnoses:  Viral URI with cough  Encounter for screening for COVID-19   Discharge Instructions   None    ED Prescriptions   None    PDMP not reviewed this encounter.   Leath-Warren, Sadie Haber, NP  12/04/23 1249  

## 2023-12-05 LAB — SARS CORONAVIRUS 2 (TAT 6-24 HRS): SARS Coronavirus 2: NEGATIVE

## 2023-12-07 ENCOUNTER — Encounter: Payer: Self-pay | Admitting: Family Medicine

## 2024-02-27 ENCOUNTER — Other Ambulatory Visit: Payer: Self-pay | Admitting: Nurse Practitioner

## 2024-02-27 DIAGNOSIS — R7303 Prediabetes: Secondary | ICD-10-CM

## 2024-02-27 DIAGNOSIS — E559 Vitamin D deficiency, unspecified: Secondary | ICD-10-CM

## 2024-02-27 DIAGNOSIS — K219 Gastro-esophageal reflux disease without esophagitis: Secondary | ICD-10-CM

## 2024-02-28 ENCOUNTER — Other Ambulatory Visit: Payer: Self-pay | Admitting: Nurse Practitioner

## 2024-02-28 DIAGNOSIS — K219 Gastro-esophageal reflux disease without esophagitis: Secondary | ICD-10-CM

## 2024-02-28 DIAGNOSIS — R7303 Prediabetes: Secondary | ICD-10-CM

## 2024-02-28 DIAGNOSIS — E559 Vitamin D deficiency, unspecified: Secondary | ICD-10-CM

## 2024-02-29 ENCOUNTER — Other Ambulatory Visit: Payer: Self-pay | Admitting: Nurse Practitioner

## 2024-02-29 DIAGNOSIS — F411 Generalized anxiety disorder: Secondary | ICD-10-CM

## 2024-02-29 NOTE — Telephone Encounter (Signed)
 Last apt 06/12/23.

## 2024-02-29 NOTE — Telephone Encounter (Signed)
 Last appt. Was 06/12/23.

## 2024-03-01 ENCOUNTER — Ambulatory Visit (INDEPENDENT_AMBULATORY_CARE_PROVIDER_SITE_OTHER): Payer: Medicare PPO

## 2024-03-01 DIAGNOSIS — Z0000 Encounter for general adult medical examination without abnormal findings: Secondary | ICD-10-CM | POA: Diagnosis not present

## 2024-03-01 DIAGNOSIS — Z1231 Encounter for screening mammogram for malignant neoplasm of breast: Secondary | ICD-10-CM

## 2024-03-01 NOTE — Patient Instructions (Signed)
 Ms. Revelle , Thank you for taking time to come for your Medicare Wellness Visit. I appreciate your ongoing commitment to your health goals. Please review the following plan we discussed and let me know if I can assist you in the future.   Referrals/Orders/Follow-Ups/Clinician Recommendations: mammogram ordered  You have an order for:  []   2D Mammogram  [x]   3D Mammogram  []   Bone Density     Please call for appointment:  [facility] [address] [place], [place] [address] [phone]     Make sure to wear two-piece clothing.  No lotions, powders, or deodorants the day of the appointment. Make sure to bring picture ID and insurance card.  Bring list of medications you are currently taking including any supplements.    This is a list of the screening recommended for you and due dates:  Health Maintenance  Topic Date Due   DTaP/Tdap/Td vaccine (1 - Tdap) Never done   Zoster (Shingles) Vaccine (1 of 2) Never done   Pneumonia Vaccine (2 of 2 - PCV) 06/01/2020   COVID-19 Vaccine (1 - 2024-25 season) Never done   Colon Cancer Screening  10/01/2023   Mammogram  02/28/2024   Flu Shot  03/14/2024*   Medicare Annual Wellness Visit  03/01/2025   DEXA scan (bone density measurement)  Completed   Hepatitis C Screening  Completed   HPV Vaccine  Aged Out  *Topic was postponed. The date shown is not the original due date.    Advanced directives: (Declined) Advance directive discussed with you today. Even though you declined this today, please call our office should you change your mind, and we can give you the proper paperwork for you to fill out.  Next Medicare Annual Wellness Visit scheduled for next year: Yes  insert Preventive Care attachment Insert FALL PREVENTION attachment if needed

## 2024-03-01 NOTE — Progress Notes (Signed)
 Subjective:   Tammy [name] is a 73 y.o. who presents for a Medicare Wellness preventive visit.  Visit Complete: Virtual I connected with  Tammy [name] on 03/01/24 by a audio enabled telemedicine application and verified that I am speaking with the correct person using two identifiers.  Patient Location: Home  Provider Location: Office/Clinic  I discussed the limitations of evaluation and management by telemedicine. The patient expressed understanding and agreed to proceed.  Vital Signs: Because this visit was a virtual/telehealth visit, some criteria may be missing or patient reported. Any vitals not documented were not able to be obtained and vitals that have been documented are patient reported.  VideoError- Librarian, academic were attempted between this provider and patient, however failed, due to patient having technical difficulties OR patient did not have access to video capability.  We continued and completed visit with audio only.   Persons Participating in Visit: n/a  AWV Questionnaire: Yes: Patient Medicare AWV questionnaire was completed by the patient on 02/29/2024; I have confirmed that all information answered by patient is correct and no changes since this date.  Cardiac Risk Factors include: advanced age (>93men, >22 women);dyslipidemia     Objective:    Today's Vitals   There is no height or weight on file to calculate BMI.     03/01/2024    9:31 AM 02/24/2023   10:01 AM 02/21/2022   10:31 AM 03/19/2020   10:56 AM 03/14/2020    2:26 PM  Advanced Directives  Does Patient Have a Medical Advance Directive? No No No No No  Would patient like information on creating a medical advance directive? No - Patient declined        Current Medications (verified) Outpatient Encounter Medications as of 03/01/2024  Medication Sig   ALPRAZolam (XANAX) 0.5 MG tablet TAKE 1 TABLET BY MOUTH AT BEDTIME AS NEEDED FOR ANXIETY   escitalopram (LEXAPRO) 20  MG tablet Take 1 tablet (20 mg total) by mouth daily.   omeprazole (PRILOSEC) 20 MG capsule Take 20 mg by mouth daily.   valACYclovir (VALTREX) 1000 MG tablet Take 2 tablets (2,000 mg) by mouth every 12 hours x 1 day at onset of symptoms.   benzonatate (TESSALON) 100 MG capsule Take 1 capsule (100 mg total) by mouth every 8 (eight) hours. (Patient not taking: Reported on 03/01/2024)   cetirizine (ZYRTEC) 10 MG tablet Take 1 tablet (10 mg total) by mouth daily. (Patient not taking: Reported on 03/01/2024)   escitalopram (LEXAPRO) 20 MG tablet Take 20 mg by mouth daily.   fluticasone (FLONASE) 50 MCG/ACT nasal spray Place 2 sprays into both nostrils daily. (Patient not taking: Reported on 03/01/2024)   nitrofurantoin, macrocrystal-monohydrate, (MACROBID) 100 MG capsule Take 1 capsule (100 mg total) by mouth 2 (two) times daily. (Patient not taking: Reported on 03/01/2024)   omeprazole (PRILOSEC) 40 MG capsule TAKE 1 CAPSULE (40 MG TOTAL) BY MOUTH DAILY. (Patient not taking: Reported on 03/01/2024)   rosuvastatin (CRESTOR) 10 MG tablet Take 1 tablet (10 mg total) by mouth daily.   No facility-administered encounter medications on file as of 03/01/2024.    Allergies (verified) Patient has no known allergies.   History: Past Medical History:  Diagnosis Date   Anxiety    Arthritis    Cataract    COVID-19 virus infection 03/30/2020   Depression    GAD (generalized anxiety disorder)    Gastroesophageal reflux disease 05/05/2018   GERD (gastroesophageal reflux disease)    History  of COVID-19    History of COVID-19 06/11/2020   Prediabetes    Past Surgical History:  Procedure Laterality Date   CESAREAN SECTION     Family History  Problem Relation Age of Onset   Diabetes Brother    Social History   Socioeconomic History   Marital status: Unknown    Spouse name: Not on file   Number of children: Not on file   Years of education: Not on file   Highest education level: Not on file   Occupational History   Not on file  Tobacco Use   Smoking status: Never    Passive exposure: Never   Smokeless tobacco: Never  Vaping Use   Vaping status: Never Used  Substance and Sexual Activity   Alcohol use: Not Currently   Drug use: Never   Sexual activity: Not Currently  Other Topics Concern   Not on file  Social History Narrative   ** Merged History Encounter **       Social Drivers of Health   Financial Resource Strain: Low Risk  (03/01/2024)   Overall Financial Resource Strain (CARDIA)    Difficulty of Paying Living Expenses: Not hard at all  Food Insecurity: No Food Insecurity (03/01/2024)   Hunger Vital Sign    Worried About Running Out of Food in the Last Year: Never true    Ran Out of Food in the Last Year: Never true  Transportation Needs: No Transportation Needs (03/01/2024)   PRAPARE - Administrator, Civil Service (Medical): No    Lack of Transportation (Non-Medical): No  Physical Activity: Insufficiently Active (03/01/2024)   Exercise Vital Sign    Days of Exercise per Week: 4 days    Minutes of Exercise per Session: 30 min  Stress: No Stress Concern Present (03/01/2024)   [employer] of Occupational Health - Occupational Stress Questionnaire    Feeling of Stress : Not at all  Social Connections: Moderately Isolated (03/01/2024)   Social Connection and Isolation Panel [NHANES]    Frequency of Communication with Friends and Family: More than three times a week    Frequency of Social Gatherings with Friends and Family: Once a week    Attends Religious Services: More than 4 times per year    Active Member of [employer] or Organizations: No    Attends Banker Meetings: Never    Marital Status: Widowed    Tobacco Counseling Counseling given: Not Answered    Clinical Intake:  Pre-visit preparation completed: Yes  Pain : No/denies pain     Nutritional Risks: None Diabetes: No  How often do you need to have someone help  you when you read instructions, pamphlets, or other written materials from your doctor or pharmacy?: 1 - Never  Interpreter Needed?: No  Information entered by :: NAllen LPN   Activities of Daily Living     02/29/2024   10:39 AM  In your present state of health, do you have any difficulty performing the following activities:  Hearing? 0  Vision? 0  Difficulty concentrating or making decisions? 0  Walking or climbing stairs? 0  Dressing or bathing? 0  Doing errands, shopping? 0  Preparing Food and eating ? N  Using the Toilet? N  In the past six months, have you accidently leaked urine? Y  Comment with a cough  Do you have problems with loss of bowel control? N  Managing your Medications? N  Managing your Finances? N  Housekeeping or managing  your Housekeeping? N    Patient Care Team: Early, Sung Amabile, NP as PCP - General (Nurse Practitioner)  Indicate any recent Medical Services you may have received from other than [facility] providers in the past year (date may be approximate).     Assessment:   This is a routine wellness examination for Dajana.  Hearing/Vision screen Hearing Screening - Comments:: Denies hearing issues Vision Screening - Comments:: Regular eye exams, [facility]   Goals Addressed             This Visit's Progress    Patient Stated       03/01/2024, denies goals       Depression Screen     03/01/2024    9:32 AM 06/12/2023   11:45 AM 02/24/2023   10:01 AM 04/22/2022   10:37 AM 02/21/2022   10:31 AM 09/06/2021    9:52 AM 06/11/2020    1:36 PM  PHQ 2/9 Scores  PHQ - 2 Score 3 0 0 0 0 0 0  PHQ- 9 Score 5          Fall Risk     02/29/2024   10:39 AM 06/12/2023   11:44 AM 02/24/2023   10:01 AM 04/22/2022   10:37 AM 02/21/2022   10:31 AM  Fall Risk   Falls in the past year? 0 0 0 0 0  Number falls in past yr: 0 0 0 0   Injury with Fall? 0 0 0 0   Risk for fall due to : Medication side effect No Fall Risks Medication side effect No Fall Risks Medication  side effect  Follow up Falls prevention discussed;Falls evaluation completed Falls evaluation completed Falls prevention discussed;Education provided;Falls evaluation completed Falls evaluation completed Falls evaluation completed;Education provided;Falls prevention discussed    MEDICARE RISK AT HOME:  Medicare Risk at Home Any stairs in or around the home?: (Patient-Rptd) Yes If so, are there any without handrails?: (Patient-Rptd) No Home free of loose throw rugs in walkways, pet beds, electrical cords, etc?: (Patient-Rptd) No Adequate lighting in your home to reduce risk of falls?: (Patient-Rptd) Yes Life alert?: (Patient-Rptd) No Use of a cane, walker or w/c?: (Patient-Rptd) No Grab bars in the bathroom?: (Patient-Rptd) No Shower chair or bench in shower?: (Patient-Rptd) No Elevated toilet seat or a handicapped toilet?: (Patient-Rptd) No  TIMED UP AND GO:  Was the test performed?  No  Cognitive Function: 6CIT completed        03/01/2024    9:35 AM 02/24/2023   10:02 AM 02/21/2022   10:33 AM  6CIT Screen  What Year? 0 points 0 points 0 points  What month? 0 points 0 points 0 points  What time? 0 points 0 points 0 points  Count back from 20 0 points 0 points 0 points  Months in reverse 4 points 4 points 0 points  Repeat phrase 2 points 4 points 6 points  Total Score 6 points 8 points 6 points    Immunizations Immunization History  Administered Date(s) Administered   Fluad Quad(high Dose 65+) 09/29/2019   Pneumococcal Polysaccharide-23 06/02/2019    Screening Tests Health Maintenance  Topic Date Due   DTaP/Tdap/Td (1 - Tdap) Never done   Zoster Vaccines- Shingrix (1 of 2) Never done   Pneumonia Vaccine 40+ Years old (2 of 2 - PCV) 06/01/2020   COVID-19 Vaccine (1 - 2024-25 season) Never done   Colonoscopy  10/01/2023   MAMMOGRAM  02/28/2024   INFLUENZA VACCINE  03/14/2024 (Originally 07/16/2023)  Medicare Annual Wellness (AWV)  03/01/2025   DEXA SCAN  Completed    Hepatitis C Screening  Completed   HPV VACCINES  Aged Out    Health Maintenance  Health Maintenance Due  Topic Date Due   DTaP/Tdap/Td (1 - Tdap) Never done   Zoster Vaccines- Shingrix (1 of 2) Never done   Pneumonia Vaccine 79+ Years old (2 of 2 - PCV) 06/01/2020   COVID-19 Vaccine (1 - 2024-25 season) Never done   Colonoscopy  10/01/2023   MAMMOGRAM  02/28/2024   Health Maintenance Items Addressed: Mammogram ordered, Declines vaccines and colonoscopy.  Additional Screening:  Vision Screening: Recommended annual ophthalmology exams for early detection of glaucoma and other disorders of the eye.  Dental Screening: Recommended annual dental exams for proper oral hygiene  Community Resource Referral / Chronic Care Management: CRR required this visit?  No   CCM required this visit?  No     Plan:     I have personally reviewed and noted the following in the patient's chart:   Medical and social history Use of alcohol, tobacco or illicit drugs  Current medications and supplements including opioid prescriptions. Patient is not currently taking opioid prescriptions. Functional ability and status Nutritional status Physical activity Advanced directives List of other physicians Hospitalizations, surgeries, and ER visits in previous 12 months Vitals Screenings to include cognitive, depression, and falls Referrals and appointments  In addition, I have reviewed and discussed with patient certain preventive protocols, quality metrics, and best practice recommendations. A written personalized care plan for preventive services as well as general preventive health recommendations were provided to patient.     Barb Merino, LPN   08/12/5620   After Visit Summary: (MyChart) Due to this being a telephonic visit, the after visit summary with patients personalized plan was offered to patient via MyChart   Notes: Nothing significant to report at this time.

## 2024-03-08 ENCOUNTER — Encounter: Payer: Self-pay | Admitting: Nurse Practitioner

## 2024-03-08 ENCOUNTER — Ambulatory Visit: Admitting: Nurse Practitioner

## 2024-03-08 VITALS — BP 128/82 | HR 82 | Wt 154.4 lb

## 2024-03-08 DIAGNOSIS — E559 Vitamin D deficiency, unspecified: Secondary | ICD-10-CM | POA: Diagnosis not present

## 2024-03-08 DIAGNOSIS — F341 Dysthymic disorder: Secondary | ICD-10-CM

## 2024-03-08 DIAGNOSIS — M545 Low back pain, unspecified: Secondary | ICD-10-CM

## 2024-03-08 DIAGNOSIS — F5105 Insomnia due to other mental disorder: Secondary | ICD-10-CM | POA: Diagnosis not present

## 2024-03-08 DIAGNOSIS — F411 Generalized anxiety disorder: Secondary | ICD-10-CM

## 2024-03-08 DIAGNOSIS — F99 Mental disorder, not otherwise specified: Secondary | ICD-10-CM

## 2024-03-08 DIAGNOSIS — E538 Deficiency of other specified B group vitamins: Secondary | ICD-10-CM

## 2024-03-08 MED ORDER — BUPROPION HCL ER (XL) 150 MG PO TB24: 150.0000 mg | ORAL_TABLET | ORAL | 3 refills | Status: DC

## 2024-03-08 MED ORDER — TRAZODONE HCL 50 MG PO TABS: 25.0000 mg | ORAL_TABLET | Freq: Every evening | ORAL | 3 refills | Status: DC | PRN

## 2024-03-08 NOTE — Patient Instructions (Signed)
 I have sent in the Wellbutrin for you to start in the morning.  I have also sent in the Trazodone to take as you need at bedtime to help with sleep.   I will check labs to make sure your vitamins and thyroid are looking good and not the problem.   I want to check back in with you in about 4 weeks and make sure we don't need to make any changes.   Managing Depression, Adult Depression is a mental health condition that affects your thoughts, feelings, and actions. Being diagnosed with depression can bring you relief if you did not know why you have felt or behaved a certain way. It could also leave you feeling overwhelmed. Finding ways to manage your symptoms can help you feel more positive about your future. How to manage lifestyle changes Being depressed is difficult. Depression can increase the level of everyday stress. Stress can make depression symptoms worse. You may believe your symptoms cannot be managed or will never improve. However, there are many things you can try to help manage your symptoms. There is hope. Managing stress  Stress is your body's reaction to life changes and events, both good and bad. Stress can add to your feelings of depression. Learning to manage your stress can help lessen your feelings of depression. Try some of the following approaches to reducing your stress (stress reduction techniques): Listen to music that you enjoy and that inspires you. Try using a meditation app or take a meditation class. Develop a practice that helps you connect with your spiritual self. Walk in nature, pray, or go to a place of worship. Practice deep breathing. To do this, inhale slowly through your nose. Pause at the top of your inhale for a few seconds and then exhale slowly, letting yourself relax. Repeat this three or four times. Practice yoga to help relax and work your muscles. Choose a stress reduction technique that works for you. These techniques take time and practice to  develop. Set aside 5-15 minutes a day to do them. Therapists can offer training in these techniques. Do these things to help manage stress: Keep a journal. Know your limits. Set healthy boundaries for yourself and others, such as saying "no" when you think something is too much. Pay attention to how you react to certain situations. You may not be able to control everything, but you can change your reaction. Add humor to your life by watching funny movies or shows. Make time for activities that you enjoy and that relax you. Spend less time using electronics, especially at night before bed. The light from screens can make your brain think it is time to get up rather than go to bed.  Medicines Medicines, such as antidepressants, are often a part of treatment for depression. Talk with your pharmacist or health care provider about all the medicines, supplements, and herbal products that you take, their possible side effects, and what medicines and other products are safe to take together. Make sure to report any side effects you may have to your health care provider. Relationships Your health care provider may suggest family therapy, couples therapy, or individual therapy as part of your treatment. How to recognize changes Everyone responds differently to treatment for depression. As you recover from depression, you may start to: Have more interest in doing activities. Feel more hopeful. Have more energy. Eat a more regular amount of food. Have better mental focus. It is important to recognize if your depression is not  getting better or is getting worse. The symptoms you had in the beginning may return, such as: Feeling tired. Eating too much or too little. Sleeping too much or too little. Feeling restless, agitated, or hopeless. Trouble focusing or making decisions. Having unexplained aches and pains. Feeling irritable, angry, or aggressive. If you or your family members notice these  symptoms coming back, let your health care provider know right away. Follow these instructions at home: Activity Try to get some form of exercise each day, such as walking. Try yoga, mindfulness, or other stress reduction techniques. Participate in group activities if you are able. Lifestyle Get enough sleep. Cut down on or stop using caffeine, tobacco, alcohol, and any other harmful substances. Eat a healthy diet that includes plenty of vegetables, fruits, whole grains, low-fat dairy products, and lean protein. Limit foods that are high in solid fats, added sugar, or salt (sodium). General instructions Take over-the-counter and prescription medicines only as told by your health care provider. Keep all follow-up visits. It is important for your health care provider to check on your mood, behavior, and medicines. Your health care provider may need to make changes to your treatment. Where to find support Talking to others  Friends and family members can be sources of support and guidance. Talk to trusted friends or family members about your condition. Explain your symptoms and let them know that you are working with a health care provider to treat your depression. Tell friends and family how they can help. Finances Find mental health providers that fit with your financial situation. Talk with your health care provider if you are worried about access to food, housing, or medicine. Call your insurance company to learn about your co-pays and prescription plan. Where to find more information You can find support in your area from: Anxiety and Depression Association of America (ADAA): adaa.org Mental Health America: mentalhealthamerica.net [employer] on Mental Illness: nami.org Contact a health care provider if: You stop taking your antidepressant medicines, and you have any of these symptoms: Nausea. Headache. Light-headedness. Chills and body aches. Not being able to sleep  (insomnia). You or your friends and family think your depression is getting worse. Get help right away if: You have thoughts of hurting yourself or others. Get help right away if you feel like you may hurt yourself or others, or have thoughts about taking your own life. Go to your nearest emergency room or: Call 911. Call the National Suicide Prevention Lifeline at 386-117-6883 or 988. This is open 24 hours a day. Text the Crisis Text Line at 3866911638. This information is not intended to replace advice given to you by your health care provider. Make sure you discuss any questions you have with your health care provider. Document Revised: 04/08/2022 Document Reviewed: 04/08/2022 Elsevier Patient Education  2024 [employer].

## 2024-03-08 NOTE — Progress Notes (Signed)
 Shawna Clamp, DNP, AGNP-c [facility]  [address] [place], [place] [address] [phone]  ESTABLISHED PATIENT- Chronic Health and/or Follow-Up Visit Discussed the use of AI scribe software for clinical note transcription with the patient, who gave verbal consent to proceed.  Blood pressure 128/82, pulse 82, weight 154 lb 6.4 oz (70 kg).    Tammy [name] is a 73 y.o. year old female presenting today for evaluation and management of chronic conditions.   History of Present Illness Tammy [name] is a 73 year old female who presents with concerns about her mood and the effectiveness of her current medication, escitalopram.  She has been taking escitalopram since 04-07-05 following her mother's passing. Initially, it was effective in managing her mood and aiding sleep, but she now experiences insomnia a few nights a week and feels her mood is poorly managed. She describes having 'no energy whatsoever' and needing to force herself to complete daily tasks. She has not tried any other medications besides escitalopram and occasionally uses Xanax, but only when absolutely necessary when her anxiety is uncontrolled, and she is unable to calm herself down. Her last prescription has lasted over a year.   She experiences insomnia a few nights a week and has tried melatonin, which had the opposite effect, keeping her awake. Ibuprofen PM sometimes helps her sleep, but not consistently. Her routine involves waking Jonathon Castelo to care for her grandchildren, which may contribute to her sleep issues as she often returns to bed and sleeps until noon or 1 PM.  She has been experiencing lower back pain for a couple of weeks, which occurs daily, particularly in the morning. She has noticed increased frequency of urination but denies any unusual odor. She is concerned it might be a urinary tract infection.  She reports a history of low vitamin D levels and mentions limited outdoor activity, which may  affect her vitamin D levels.  She mentions a family history of depression, noting that her twin brother suffered from deep depression before his death in 2013/04/07. She also recently lost her best friend to cancer, which she feels has contributed to her current mood.   All ROS negative with exception of what is listed above.   PHYSICAL EXAM Physical Exam Vitals and nursing note reviewed.  Constitutional:      Appearance: Normal appearance.  HENT:     Head: Normocephalic.  Eyes:     Conjunctiva/sclera: Conjunctivae normal.  Cardiovascular:     Rate and Rhythm: Normal rate and regular rhythm.     Pulses: Normal pulses.     Heart sounds: Normal heart sounds.  Pulmonary:     Effort: Pulmonary effort is normal.     Breath sounds: Normal breath sounds.  Musculoskeletal:        General: Normal range of motion.  Skin:    General: Skin is warm and dry.     Capillary Refill: Capillary refill takes less than 2 seconds.  Neurological:     Mental Status: She is alert and oriented to person, place, and time.  Psychiatric:        Mood and Affect: Mood normal.        Behavior: Behavior normal.      PLAN Problem List Items Addressed This Visit     GAD (generalized anxiety disorder)   Utilizing PRN alprazolam for panic and anxiety symptoms not well controlled. PDMP reviewed with no alarm symptoms present. Recommend avoiding use for sleep to reduce the risk of dependence. OK  to continue for as needed anxiety.  - Refills sent      Relevant Medications   buPROPion (WELLBUTRIN XL) 150 MG 24 hr tablet   traZODone (DESYREL) 50 MG tablet   Depression - Primary   Escitalopram is no longer effective in managing her mood and sleep. She experiences low energy, lack of motivation, and difficulty sleeping a few nights a week. There is a family history of depression and Alzheimer's disease. Bupropion was discussed to address depressive symptoms and counteract escitalopram side effects, such as weight  gain and decreased sexual desire. Bupropion is expected to increase energy and improve mood. She is open to trying bupropion and trazodone for sleep. Bupropion should be taken in the morning to avoid insomnia, with changes noticeable in about a week and full effects in three weeks. - Prescribe bupropion to be taken in the morning. - Prescribe trazodone for sleep as needed. - Continue escitalopram with the possibility of tapering off if bupropion is effective. - Check vitamin D and B12 levels to rule out deficiencies contributing to symptoms. - Schedule a follow-up in 4-6 weeks via video or virtual visit to assess the effectiveness of the new medication regimen.      Relevant Medications   buPROPion (WELLBUTRIN XL) 150 MG 24 hr tablet   traZODone (DESYREL) 50 MG tablet   Other Relevant Orders   Vitamin B12 (Completed)   VITAMIN D 25 Hydroxy (Vit-D Deficiency, Fractures) (Completed)   TSH (Completed)   Vitamin D deficiency   Vitamin D Deficiency She has low vitamin D levels and limited outdoor activity, which may contribute to her symptoms. - Check vitamin D and B12 levels.      Relevant Orders   VITAMIN D 25 Hydroxy (Vit-D Deficiency, Fractures) (Completed)   TSH (Completed)   Acute bilateral low back pain without sciatica   Lower Back Pain She reports daily lower back pain for a couple of weeks, with increased urination. Suspect a possible urinary tract infection (UTI) and plan to check a urine sample to rule out kidney involvement. - Obtain a urine sample to check for UTI. - Prescribe an antibiotic if the urine test indicates a UTI.      Relevant Orders   POCT URINALYSIS DIP (CLINITEK) (Completed)   Insomnia due to other mental disorder   Insomnia She experiences difficulty sleeping a few nights a week. Melatonin was ineffective, and ibuprofen PM is inconsistently effective. Trazodone was suggested as a non-habit-forming option for sleep, without causing next-day drowsiness. -  Prescribe trazodone for sleep as needed.      Relevant Medications   traZODone (DESYREL) 50 MG tablet   Other Relevant Orders   Vitamin B12 (Completed)   VITAMIN D 25 Hydroxy (Vit-D Deficiency, Fractures) (Completed)   TSH (Completed)   Other Visit Diagnoses       B12 deficiency       Relevant Orders   Vitamin B12 (Completed)       Return for Virtual Mood Recheck, Med Management 30 4-6 weeks.  Shawna Clamp, DNP, AGNP-c

## 2024-03-08 NOTE — Assessment & Plan Note (Signed)
 Vitamin D Deficiency She has low vitamin D levels and limited outdoor activity, which may contribute to her symptoms. - Check vitamin D and B12 levels.

## 2024-03-08 NOTE — Assessment & Plan Note (Signed)
 Insomnia She experiences difficulty sleeping a few nights a week. Melatonin was ineffective, and ibuprofen PM is inconsistently effective. Trazodone was suggested as a non-habit-forming option for sleep, without causing next-day drowsiness. - Prescribe trazodone for sleep as needed.

## 2024-03-08 NOTE — Assessment & Plan Note (Signed)
 Escitalopram is no longer effective in managing her mood and sleep. She experiences low energy, lack of motivation, and difficulty sleeping a few nights a week. There is a family history of depression and Alzheimer's disease. Bupropion was discussed to address depressive symptoms and counteract escitalopram side effects, such as weight gain and decreased sexual desire. Bupropion is expected to increase energy and improve mood. She is open to trying bupropion and trazodone for sleep. Bupropion should be taken in the morning to avoid insomnia, with changes noticeable in about a week and full effects in three weeks. - Prescribe bupropion to be taken in the morning. - Prescribe trazodone for sleep as needed. - Continue escitalopram with the possibility of tapering off if bupropion is effective. - Check vitamin D and B12 levels to rule out deficiencies contributing to symptoms. - Schedule a follow-up in 4-6 weeks via video or virtual visit to assess the effectiveness of the new medication regimen.

## 2024-03-08 NOTE — Assessment & Plan Note (Signed)
 Lower Back Pain She reports daily lower back pain for a couple of weeks, with increased urination. Suspect a possible urinary tract infection (UTI) and plan to check a urine sample to rule out kidney involvement. - Obtain a urine sample to check for UTI. - Prescribe an antibiotic if the urine test indicates a UTI.

## 2024-03-08 NOTE — Assessment & Plan Note (Signed)
 Utilizing PRN alprazolam for panic and anxiety symptoms not well controlled. PDMP reviewed with no alarm symptoms present. Recommend avoiding use for sleep to reduce the risk of dependence. OK to continue for as needed anxiety.  - Refills sent

## 2024-03-09 LAB — POCT URINALYSIS DIP (CLINITEK)
Blood, UA: NEGATIVE
Glucose, UA: NEGATIVE mg/dL
Ketones, POC UA: NEGATIVE mg/dL
Leukocytes, UA: NEGATIVE
Nitrite, UA: NEGATIVE
POC PROTEIN,UA: NEGATIVE
Spec Grav, UA: 1.025 (ref 1.010–1.025)
Urobilinogen, UA: 0.2 U/dL
pH, UA: 6 (ref 5.0–8.0)

## 2024-03-09 LAB — VITAMIN D 25 HYDROXY (VIT D DEFICIENCY, FRACTURES): Vit D, 25-Hydroxy: 23.6 ng/mL — ABNORMAL LOW (ref 30.0–100.0)

## 2024-03-09 LAB — TSH: TSH: 1.38 u[IU]/mL (ref 0.450–4.500)

## 2024-03-09 LAB — VITAMIN B12: Vitamin B-12: 250 pg/mL (ref 232–1245)

## 2024-03-14 ENCOUNTER — Other Ambulatory Visit: Payer: Self-pay

## 2024-03-14 MED ORDER — VITAMIN D (ERGOCALCIFEROL) 1.25 MG (50000 UNIT) PO CAPS
50000.0000 [IU] | ORAL_CAPSULE | ORAL | 1 refills | Status: AC
Start: 2024-03-14 — End: ?

## 2024-03-30 ENCOUNTER — Ambulatory Visit

## 2024-03-31 ENCOUNTER — Other Ambulatory Visit: Payer: Self-pay | Admitting: Nurse Practitioner

## 2024-03-31 DIAGNOSIS — F5105 Insomnia due to other mental disorder: Secondary | ICD-10-CM

## 2024-03-31 DIAGNOSIS — F341 Dysthymic disorder: Secondary | ICD-10-CM

## 2024-03-31 NOTE — Telephone Encounter (Signed)
 Requesting 90 day supply it looks like.

## 2024-04-07 ENCOUNTER — Ambulatory Visit
Admission: RE | Admit: 2024-04-07 | Discharge: 2024-04-07 | Disposition: A | Discharge status: Home / Self Care | Source: Ambulatory Visit | Attending: Nurse Practitioner | Admitting: Nurse Practitioner

## 2024-04-07 DIAGNOSIS — Z1231 Encounter for screening mammogram for malignant neoplasm of breast: Secondary | ICD-10-CM | POA: Diagnosis not present

## 2024-04-13 ENCOUNTER — Encounter: Payer: Self-pay | Admitting: Nurse Practitioner

## 2024-04-14 ENCOUNTER — Encounter: Payer: Self-pay | Admitting: Nurse Practitioner

## 2024-04-14 ENCOUNTER — Telehealth: Admitting: Nurse Practitioner

## 2024-04-14 VITALS — Wt 150.0 lb

## 2024-04-14 DIAGNOSIS — F411 Generalized anxiety disorder: Secondary | ICD-10-CM | POA: Diagnosis not present

## 2024-04-14 DIAGNOSIS — F32A Depression, unspecified: Secondary | ICD-10-CM

## 2024-04-14 DIAGNOSIS — F341 Dysthymic disorder: Secondary | ICD-10-CM

## 2024-04-14 MED ORDER — ESCITALOPRAM OXALATE 20 MG PO TABS: 20.0000 mg | ORAL_TABLET | Freq: Every day | ORAL | 3 refills | Status: AC

## 2024-04-14 NOTE — Assessment & Plan Note (Signed)
 Mood improvement with better sleep without hypnotics and no recent Xanax use. Enhanced energy levels, though not expecting high physical activity. Continued improvement anticipated with ongoing medication. New onset of intermittent tremor, likely due to wellbutrin  due to onset at the time of medication start. Tremor is mild and not interfering with daily activities. We discussed that we can change or adjust medication if these get worse or become bothersome, I am hopeful this will improve as she continues with the medication in her system  - Continue current medication regimen with Wellbutrin  daily and trazodone  if needed for sleep. - Refill Lexapro  prescription. - Let me know if the tremor worsens or starts to bother you

## 2024-04-14 NOTE — Progress Notes (Signed)
 Virtual Visit Encounter mychart visit.   I connected with  Tammy [name] on 04/14/24 at 11:00 AM EDT by secure video and audio telemedicine application. I verified that I am speaking with the correct person using two identifiers.   I introduced myself as a Publishing rights manager with the practice. The limitations of evaluation and management by telemedicine discussed with the patient and the availability of in person appointments. The patient expressed verbal understanding and consent to proceed.  Participating parties in this visit include: Myself and patient  The patient is: Patient Location: Home I am: Provider Location: Office/Clinic Subjective:    CC and HPI: Tammy [name] is a 73 y.o. year old female presenting for follow-up for mood.   Pressley started wellbutrin  as an adjunct for depressive symptoms abot 4 weeks ago. She is also taking lexapro  daily and uses xanax very infrequently. She also stated trazodone  to aid in insomnia.   Since our last visit she reports an improvement in her mood and overall well-being. She has discontinued the use of trazodone  after initially using them twice, as she is now able to fall asleep without assistance. Her energy levels have improved, although she acknowledges limitations due to her age. She has not needed to use Xanax recently and only uses it when absolutely necessary.  She describes experiencing an intermittent tremor in her hands, which was not present before starting her current medication, Wellbutrin , four weeks ago. The tremor does not interfere with daily activities such as eating or holding a drink.  No nervousness, but she reports a hand tremor. Improved sleep and energy levels.  Past medical history, Surgical history, Family history not pertinant except as noted below, Social history, Allergies, and medications have been entered into the medical record, reviewed, and corrections made.   Review of Systems:  All review of systems negative  except what is listed in the HPI  Objective:    Alert and oriented x 4 Speaking in clear sentences with no shortness of breath. No distress.  Impression and Recommendations:    Problem List Items Addressed This Visit     GAD (generalized anxiety disorder)   No recent use. Available for prn panic and anxiety. No concerns.       Relevant Medications   escitalopram  (LEXAPRO ) 20 MG tablet   Depression - Primary   Mood improvement with better sleep without hypnotics and no recent Xanax use. Enhanced energy levels, though not expecting high physical activity. Continued improvement anticipated with ongoing medication. New onset of intermittent tremor, likely due to wellbutrin  due to onset at the time of medication start. Tremor is mild and not interfering with daily activities. We discussed that we can change or adjust medication if these get worse or become bothersome, I am hopeful this will improve as she continues with the medication in her system  - Continue current medication regimen with Wellbutrin  daily and trazodone  if needed for sleep. - Refill Lexapro  prescription. - Let me know if the tremor worsens or starts to bother you       Relevant Medications   escitalopram  (LEXAPRO ) 20 MG tablet    current treatment plan is effective, no change in therapy I discussed the assessment and treatment plan with the patient. The patient was provided an opportunity to ask questions and all were answered. The patient agreed with the plan and demonstrated an understanding of the instructions.   The patient was advised to call back or seek an in-person evaluation if the symptoms worsen  or if the condition fails to improve as anticipated.  Follow-Up: in 6 months  I provided 12  minutes of non-face-to-face interaction with this non face-to-face encounter including intake, same-day documentation, and chart review.   Annella Kief, NP , DNP, AGNP-c [facility]  [facility]

## 2024-04-14 NOTE — Assessment & Plan Note (Signed)
 No recent use. Available for prn panic and anxiety. No concerns.

## 2024-04-26 ENCOUNTER — Ambulatory Visit: Payer: Self-pay

## 2024-04-26 NOTE — Telephone Encounter (Signed)
 Appointment scheduled.

## 2024-04-26 NOTE — Telephone Encounter (Signed)
 Copied from CRM [id number]. Topic: Clinical - Red Word Triage >> Apr 26, 2024  3:35 PM Hamp Levine R wrote: Red Word that prompted transfer to Nurse Triage: Patient states she has been sick since Saturday. Has a cough, sore throat, and weakness.   Chief Complaint: Sore throat  Symptoms: Sore throat, cough Frequency: Constant  Pertinent Negatives: Patient denies fever Disposition: [] ED /[x] Urgent Care (no appt availability in office) / [] Appointment(In office/virtual)/ []  [facility]/ [] Home Care/ [] Refused Recommended Disposition /[] [facility]/ []  Follow-up with PCP Additional Notes: Patient reports she has had a sore throat for the last 3 days. She states her sore throat is constant and rates it as 7/10. She states that she is also experiencing a cough now and would like to make an appointment for evaluation. Patient advised there are no appointments until next week and was advised to go to urgent care if she feels like she needs to be seen before then. Patient understood and is agreeable with this plan.     Reason for Disposition  [1] Sore throat with cough/cold symptoms AND [2] present < 5 days  Answer Assessment - Initial Assessment Questions 1. ONSET: "When did the throat start hurting?" (Hours or days ago)      3 days ago  2. SEVERITY: "How bad is the sore throat?" (Scale 1-10; mild, moderate or severe)   - MILD (1-3):  Doesn't interfere with eating or normal activities.   - MODERATE (4-7): Interferes with eating some solids and normal activities.   - SEVERE (8-10):  Excruciating pain, interferes with most normal activities.   - SEVERE WITH DYSPHAGIA (10): Can't swallow liquids, drooling.     7/10 3. STREP EXPOSURE: "Has there been any exposure to strep within the past week?" If Yes, ask: "What type of contact occurred?"      Unsure  4.  VIRAL SYMPTOMS: "Are there any symptoms of a cold, such as a runny nose, cough, hoarse voice or red eyes?"      Cough 5. FEVER:  "Do you have a fever?" If Yes, ask: "What is your temperature, how was it measured, and when did it start?"     No 6. PUS ON THE TONSILS: "Is there pus on the tonsils in the back of your throat?"     No 7. OTHER SYMPTOMS: "Do you have any other symptoms?" (e.g., difficulty breathing, headache, rash)     No  Protocols used: Sore Throat-A-AH

## 2024-04-27 ENCOUNTER — Ambulatory Visit (INDEPENDENT_AMBULATORY_CARE_PROVIDER_SITE_OTHER): Admitting: Medical

## 2024-04-27 VITALS — BP 120/70 | HR 79 | Temp 97.8°F | Resp 16 | Wt 152.0 lb

## 2024-04-27 DIAGNOSIS — R051 Acute cough: Secondary | ICD-10-CM

## 2024-04-27 DIAGNOSIS — J069 Acute upper respiratory infection, unspecified: Secondary | ICD-10-CM

## 2024-04-27 LAB — POCT INFLUENZA A/B
Influenza A, POC: NEGATIVE
Influenza B, POC: NEGATIVE

## 2024-04-27 LAB — POC COVID19 BINAXNOW: SARS Coronavirus 2 Ag: NEGATIVE

## 2024-04-27 NOTE — Progress Notes (Signed)
 Subjective:  AZEL KELCH is a 73 y.o. female who presents for Chief Complaint  Patient presents with   Acute Visit    Sore throat, cough, possible fever, and weakness. Started Saturday.      Here for acute illness x 5 days.  She notes cough.  Had sore throat Saturday 5 days go but that is some better.   Some cough.   Has post nasal drainage, some weakness.  No ear pain, some headaches.  No NVD.   No fever, body aches or chill.  Using some advil.  Not using any OTC cough or decongestant medications. Nonsmoker.  No hx/o lung disease.  No other aggravating or relieving factors.    No other c/o.  The following portions of the patient's history were reviewed and updated as appropriate: allergies, current medications, past family history, past medical history, past social history, past surgical history and problem list.  ROS Otherwise as in subjective above    Objective: BP 120/70   Pulse 79   Temp 97.8 F (36.6 C)   Resp 16   Wt 152 lb (68.9 kg)   SpO2 97%   BMI 26.93 kg/m   General appearance: alert, no distress, well developed, well nourished HEENT: normocephalic, sclerae anicteric, conjunctiva pink and moist, TMs pearly, nares with mild turbinate edema, clear discharge , mild erythema, pharynx normal Oral cavity: MMM, no lesions Neck: supple, no lymphadenopathy, no thyromegaly, no masses Heart: RRR, normal S1, S2, no murmurs Lungs: somewhat coarse, but no wheezes, rhonchi, or rales    Assessment: Encounter Diagnoses  Name Primary?   Acute cough Yes   Upper respiratory tract infection, unspecified type      Plan: Symptoms and exam suggest viral URI.  Specific home care recommendations today include: Only take over-the-counter (OTC) or prescription medicines for pain, discomfort, or fever as directed by your caregiver.   Decongestant/cough: You may use OTC Guaifenesin (Mucinex DM) for cough and congestion.  Samples of Mucinex DM given.   Sore throat remedies:  You  may use salt water gargles, warm fluids such as coffee or hot tea, or honey/tea/lemon mixture to sooth sore throat pain.  You may use OTC sore throat remedies such as Cepacol lozenges or Chloraseptic spray for sore throat pain. Runny nose and sneezing remedies: You may use OTC antihistamine such as Zyrtec  or Benadryl, but caution as these can cause drowsiness.   Pain/fever relief: You may use over-the-counter Tylenol for pain or fever Drink extra fluids. Fluids help thin the mucus so your sinuses can drain more easily.  Applying either moist heat or ice packs to the sinus areas may help relieve discomfort. Use saline nasal sprays to help moisten your sinuses. The sprays can be found at your local drugstore.   Patient was advised to call or return if worse or not improving in the next few days.    Patient voiced understanding of diagnosis, recommendations, and treatment plan.   Anari was seen today for acute visit.  Diagnoses and all orders for this visit:  Acute cough -     Influenza A/B -     POC COVID-19  Upper respiratory tract infection, unspecified type   Follow up: prn

## 2024-05-06 ENCOUNTER — Ambulatory Visit
Admission: EM | Admit: 2024-05-06 | Discharge: 2024-05-06 | Disposition: A | Discharge status: Home / Self Care | Attending: Nurse Practitioner | Admitting: Nurse Practitioner

## 2024-05-06 DIAGNOSIS — R21 Rash and other nonspecific skin eruption: Secondary | ICD-10-CM

## 2024-05-06 MED ORDER — PREDNISONE 20 MG PO TABS: 40.0000 mg | ORAL_TABLET | Freq: Every day | ORAL | 0 refills | Status: AC

## 2024-05-06 MED ORDER — TRIAMCINOLONE ACETONIDE 0.1 % EX CREA: 1.0000 | TOPICAL_CREAM | Freq: Two times a day (BID) | CUTANEOUS | 0 refills | Status: DC

## 2024-05-06 MED ORDER — DEXAMETHASONE SODIUM PHOSPHATE 10 MG/ML IJ SOLN
10.0000 mg | INTRAMUSCULAR | Status: AC
  Administered 2024-05-06: 10 mg via INTRAMUSCULAR

## 2024-05-06 NOTE — Discharge Instructions (Addendum)
 You were given an injection of Decadron 10mg  today. Start the prednisone on 05/07/24. Take medication as prescribed. May also take over-the-counter Zyrtec  during the daytime and Benadryl at bedtime to help with itching. Avoid hot baths or showers while symptoms persist.  Recommend taking lukewarm baths. May apply cool cloths to the area to help with itching or discomfort. Avoid scratching, rubbing, or manipulating the areas while symptoms persist. Recommend Aveeno colloidal oatmeal bath to use to help with drying and itching. Follow up if symptoms do not improve.

## 2024-05-06 NOTE — ED Provider Notes (Signed)
 [facility]    CSN: [id number] Arrival date & time: 05/06/24  1629      History   Chief Complaint No chief complaint on file.   HPI Tammy [name] is a 73 y.o. female.   The history is provided by the patient.   Patient with a 1 day history of rash to the neck, right chest, and around the right eye.  Patient states that she was pulling weeds from around her porch prior to symptoms starting.  She states that the rash is itchy.  She denies exposure to new soaps, medications, lotions, foods, or detergent.  Further denies fever, chills, or foul-smelling drainage from the site.  Patient has not taken any medication for her symptoms.  Past Medical History:  Diagnosis Date   Anxiety    Arthritis    Cataract    COVID-19 virus infection 03/30/2020   Depression    GAD (generalized anxiety disorder)    Gastroesophageal reflux disease 05/05/2018   GERD (gastroesophageal reflux disease)    History of COVID-19    History of COVID-19 06/11/2020   Prediabetes     Patient Active Problem List   Diagnosis Date Noted   Insomnia due to other mental disorder 03/08/2024   Candidal intertrigo 07/26/2023   Diverticulosis of colon 04/22/2022   Mixed hyperlipidemia 04/22/2022   Body mass index (BMI) of 29.0-29.9 in adult 04/22/2022   Herpes labialis 04/22/2022   Acute bilateral low back pain without sciatica 04/22/2022   Vitamin D  deficiency 06/11/2020   Fatigue 03/30/2020   Hypokalemia 03/30/2020   GERD (gastroesophageal reflux disease)    Depression 03/23/2019   GAD (generalized anxiety disorder)    Prediabetes     Past Surgical History:  Procedure Laterality Date   CESAREAN SECTION      OB History   No obstetric history on file.      Home Medications    Prior to Admission medications   Medication Sig Start Date End Date Taking? Authorizing Provider  ALPRAZolam (XANAX) 0.5 MG tablet TAKE 1 TABLET BY MOUTH AT BEDTIME AS NEEDED FOR ANXIETY 03/01/24   Early, Sara E,  NP  buPROPion  (WELLBUTRIN  XL) 150 MG 24 hr tablet TAKE 1 TABLET BY MOUTH EVERY DAY IN THE MORNING 04/04/24   Early, Sara E, NP  escitalopram  (LEXAPRO ) 20 MG tablet Take 1 tablet (20 mg total) by mouth daily. 04/14/24   Early, Sara E, NP  traZODone  (DESYREL ) 50 MG tablet TAKE 0.5-1 TABLET BY MOUTH AT BEDTIME AS NEEDED FOR SLEEP. 04/04/24   Early, Sara E, NP  valACYclovir (VALTREX) 1000 MG tablet Take 2 tablets (2,000 mg) by mouth every 12 hours x 1 day at onset of symptoms. 04/22/22   Chana Comas, PA-C  vitamin B-12 (CYANOCOBALAMIN ) 500 MCG tablet Take 500 mcg by mouth daily.    [provider]  Vitamin D , Ergocalciferol , (DRISDOL ) 1.25 MG (50000 UNIT) CAPS capsule Take 1 capsule (50,000 Units total) by mouth every 7 (seven) days. 03/14/24   EarlyAdriane Albe, NP    Family History Family History  Problem Relation Age of Onset   Diabetes Brother     Social History Social History   Tobacco Use   Smoking status: Never    Passive exposure: Never   Smokeless tobacco: Never  Vaping Use   Vaping status: Never Used  Substance Use Topics   Alcohol use: Not Currently   Drug use: Never     Allergies   Patient has no known  allergies.   Review of Systems Review of Systems Per HPI  Physical Exam Triage Vital Signs ED Triage Vitals  Encounter Vitals Group     BP 05/06/24 1647 120/77     Systolic BP Percentile --      Diastolic BP Percentile --      Pulse Rate 05/06/24 1647 71     Resp 05/06/24 1647 18     Temp 05/06/24 1647 98.6 F (37 C)     Temp Source 05/06/24 1647 Oral     SpO2 05/06/24 1647 98 %     Weight --      Height --      Head Circumference --      Peak Flow --      Pain Score 05/06/24 1646 0     Pain Loc --      Pain Education --      Exclude from Growth Chart --    No data found.  Updated Vital Signs BP 120/77 (BP Location: Right Arm)   Pulse 71   Temp 98.6 F (37 C) (Oral)   Resp 18   SpO2 98%   Visual Acuity Right Eye Distance: 20/70 (pt  wearing corrective lenses) Left Eye Distance: 20/40 (pt wearing corrective lenses) Bilateral Distance: 20/40 (pt wearing corrective lenses)  Right Eye Near:   Left Eye Near:    Bilateral Near:     Physical Exam Vitals and nursing note reviewed.  Constitutional:      General: She is not in acute distress.    Appearance: Normal appearance.  HENT:     Head: Normocephalic.  Eyes:     Extraocular Movements: Extraocular movements intact.     Conjunctiva/sclera: Conjunctivae normal.     Pupils: Pupils are equal, round, and reactive to light.  Pulmonary:     Effort: Pulmonary effort is normal.  Musculoskeletal:     Cervical back: Normal range of motion.  Skin:    General: Skin is warm and dry.     Findings: Rash present. Rash is macular and papular.     Comments: Maculopapular rash underneath the right eye, bilateral neck and right chest.  There is no oozing, fluctuance, or drainage present.  Neurological:     General: No focal deficit present.     Mental Status: She is alert and oriented to person, place, and time.  Psychiatric:        Mood and Affect: Mood normal.        Behavior: Behavior normal.      UC Treatments / Results  Labs (all labs ordered are listed, but only abnormal results are displayed) Labs Reviewed - No data to display  EKG   Radiology No results found.  Procedures Procedures (including critical care time)  Medications Ordered in UC Medications - No data to display  Initial Impression / Assessment and Plan / UC Course  I have reviewed the triage vital signs and the nursing notes.  Pertinent labs & imaging results that were available during my care of the patient were reviewed by me and considered in my medical decision making (see chart for details).  Patient with rash and nonspecific skin eruption.  Symptoms most likely due to contact with poison ivy plants around her porch.  Decadron 10 mg IM administered.  Prescription provided for prednisone  20 mg for the next 5 days and triamcinolone cream 0.1%.  Supportive care recommendations were provided and discussed with the patient to include over-the-counter antihistamines, cool compresses to  the affected area, and use of Aveeno colloidal oatmeal bath.  Discussed indications with patient regarding follow-up.  Patient was in agreement with this plan of care and verbalizes understanding.  All questions were answered.  Patient stable for discharge.  Final Clinical Impressions(s) / UC Diagnoses   Final diagnoses:  None   Discharge Instructions   None    ED Prescriptions   None    PDMP not reviewed this encounter.   Hardy Lia, NP 05/06/24 1728

## 2024-05-06 NOTE — ED Triage Notes (Addendum)
 Pt reports a rash on the neck and eyes along with swelling x 1 day states she has had difficulty seeing out of both eyes since noticing the rash on yesterday.

## 2024-06-03 ENCOUNTER — Other Ambulatory Visit: Payer: Self-pay | Admitting: Nurse Practitioner

## 2024-06-03 DIAGNOSIS — E559 Vitamin D deficiency, unspecified: Secondary | ICD-10-CM

## 2024-06-03 DIAGNOSIS — R7303 Prediabetes: Secondary | ICD-10-CM

## 2024-06-03 DIAGNOSIS — K219 Gastro-esophageal reflux disease without esophagitis: Secondary | ICD-10-CM

## 2024-08-02 ENCOUNTER — Other Ambulatory Visit: Payer: Self-pay

## 2024-08-02 ENCOUNTER — Ambulatory Visit
Admission: EM | Admit: 2024-08-02 | Discharge: 2024-08-02 | Disposition: A | Discharge status: Home / Self Care | Attending: Nurse Practitioner | Admitting: Nurse Practitioner

## 2024-08-02 ENCOUNTER — Encounter: Payer: Self-pay | Admitting: Emergency Medicine

## 2024-08-02 DIAGNOSIS — R21 Rash and other nonspecific skin eruption: Secondary | ICD-10-CM

## 2024-08-02 MED ORDER — TRIAMCINOLONE ACETONIDE 0.1 % EX CREA: 1.0000 | TOPICAL_CREAM | Freq: Two times a day (BID) | CUTANEOUS | 0 refills | Status: AC

## 2024-08-02 MED ORDER — PREDNISONE 20 MG PO TABS: 40.0000 mg | ORAL_TABLET | Freq: Every day | ORAL | 0 refills | Status: AC

## 2024-08-02 MED ORDER — DEXAMETHASONE SODIUM PHOSPHATE 10 MG/ML IJ SOLN
10.0000 mg | INTRAMUSCULAR | Status: AC
  Administered 2024-08-02: 10 mg via INTRAMUSCULAR

## 2024-08-02 NOTE — ED Provider Notes (Signed)
 [facility]    CSN: [id number] Arrival date & time: 08/02/24  1557      History   Chief Complaint Chief Complaint  Patient presents with   Rash    HPI Tammy [name] is a 73 y.o. female.   The history is provided by the patient.   Patient with a 2-day history of rash to the left neck, around the left eye, bilateral elbows and hands. .  Patient denies exposure to new soaps, medications, lotions, foods, or detergents.  She states that the rash is itchy.  Further denies fever, chills, chest pain, shortness of breath, or difficulty breathing.  So far, states she has been using over-the-counter Benadryl and calamine lotion with no relief of her symptoms.    Past Medical History:  Diagnosis Date   Anxiety    Arthritis    Cataract    COVID-19 virus infection 03/30/2020   Depression    GAD (generalized anxiety disorder)    Gastroesophageal reflux disease 05/05/2018   GERD (gastroesophageal reflux disease)    History of COVID-19    History of COVID-19 06/11/2020   Prediabetes     Patient Active Problem List   Diagnosis Date Noted   Insomnia due to other mental disorder 03/08/2024   Candidal intertrigo 07/26/2023   Diverticulosis of colon 04/22/2022   Mixed hyperlipidemia 04/22/2022   Body mass index (BMI) of 29.0-29.9 in adult 04/22/2022   Herpes labialis 04/22/2022   Acute bilateral low back pain without sciatica 04/22/2022   Vitamin D  deficiency 06/11/2020   Fatigue 03/30/2020   Hypokalemia 03/30/2020   GERD (gastroesophageal reflux disease)    Depression 03/23/2019   GAD (generalized anxiety disorder)    Prediabetes     Past Surgical History:  Procedure Laterality Date   CESAREAN SECTION      OB History   No obstetric history on file.      Home Medications    Prior to Admission medications   Medication Sig Start Date End Date Taking? Authorizing Provider  ALPRAZolam (XANAX) 0.5 MG tablet TAKE 1 TABLET BY MOUTH AT BEDTIME AS NEEDED FOR  ANXIETY 03/01/24   Early, Sara E, NP  buPROPion  (WELLBUTRIN  XL) 150 MG 24 hr tablet TAKE 1 TABLET BY MOUTH EVERY DAY IN THE MORNING 04/04/24   Early, Sara E, NP  escitalopram  (LEXAPRO ) 20 MG tablet Take 1 tablet (20 mg total) by mouth daily. 04/14/24   Early, Sara E, NP  traZODone  (DESYREL ) 50 MG tablet TAKE 0.5-1 TABLET BY MOUTH AT BEDTIME AS NEEDED FOR SLEEP. 04/04/24   Early, Sara E, NP  triamcinolone  cream (KENALOG ) 0.1 % Apply 1 Application topically 2 (two) times daily. 05/06/24   Leath-Warren, Etta PARAS, NP  valACYclovir (VALTREX) 1000 MG tablet Take 2 tablets (2,000 mg) by mouth every 12 hours x 1 day at onset of symptoms. 04/22/22   Trudy Hendricks NOVAK, PA-C  vitamin B-12 (CYANOCOBALAMIN ) 500 MCG tablet Take 500 mcg by mouth daily.    [provider]  Vitamin D , Ergocalciferol , (DRISDOL ) 1.25 MG (50000 UNIT) CAPS capsule Take 1 capsule (50,000 Units total) by mouth every 7 (seven) days. 03/14/24   Oris Camie BRAVO, NP    Family History Family History  Problem Relation Age of Onset   Diabetes Brother     Social History Social History   Tobacco Use   Smoking status: Never    Passive exposure: Never   Smokeless tobacco: Never  Vaping Use   Vaping status: Never Used  Substance Use Topics   Alcohol use: Not Currently   Drug use: Never     Allergies   Patient has no known allergies.   Review of Systems Review of Systems   Physical Exam Triage Vital Signs ED Triage Vitals  Encounter Vitals Group     BP 08/02/24 1627 119/74     Girls Systolic BP Percentile --      Girls Diastolic BP Percentile --      Boys Systolic BP Percentile --      Boys Diastolic BP Percentile --      Pulse Rate 08/02/24 1627 73     Resp 08/02/24 1627 20     Temp 08/02/24 1627 98.2 F (36.8 C)     Temp Source 08/02/24 1627 Oral     SpO2 08/02/24 1627 95 %     Weight --      Height --      Head Circumference --      Peak Flow --      Pain Score 08/02/24 1626 0     Pain Loc --      Pain  Education --      Exclude from Growth Chart --    No data found.  Updated Vital Signs BP 119/74 (BP Location: Right Arm)   Pulse 73   Temp 98.2 F (36.8 C) (Oral)   Resp 20   SpO2 95%   Visual Acuity Right Eye Distance:   Left Eye Distance:   Bilateral Distance:    Right Eye Near:   Left Eye Near:    Bilateral Near:     Physical Exam Vitals and nursing note reviewed.  Constitutional:      General: She is not in acute distress.    Appearance: Normal appearance.  HENT:     Head: Normocephalic.  Cardiovascular:     Rate and Rhythm: Normal rate and regular rhythm.     Pulses: Normal pulses.     Heart sounds: Normal heart sounds.  Pulmonary:     Effort: Pulmonary effort is normal.     Breath sounds: Normal breath sounds.  Musculoskeletal:     Cervical back: Normal range of motion.  Skin:    General: Skin is warm and dry.     Findings: Rash present. Rash is macular and papular.     Comments: Erythematous maculopapular rash noted on the left cheek under the left eye, left neck, on the bilateral antecubital spaces, and bilateral hands.  There is no oozing, fluctuance, or drainage present.  Neurological:     General: No focal deficit present.     Mental Status: She is alert and oriented to person, place, and time.  Psychiatric:        Mood and Affect: Mood normal.        Behavior: Behavior normal.      UC Treatments / Results  Labs (all labs ordered are listed, but only abnormal results are displayed) Labs Reviewed - No data to display  EKG   Radiology No results found.  Procedures Procedures (including critical care time)  Medications Ordered in UC Medications  dexamethasone  (DECADRON ) injection 10 mg (10 mg Intramuscular Given 08/02/24 1652)    Initial Impression / Assessment and Plan / UC Course  I have reviewed the triage vital signs and the nursing notes.  Pertinent labs & imaging results that were available during my care of the patient were  reviewed by me and considered in my medical decision making (see  chart for details).  Patient with rash to the bilateral antecubitals, on her face, hands, and left neck.  Difficult to determine the origin of the rash as patient denies any possible triggers.  Decadron  10 mg IM administered.  Will treat with prednisone  40 mg for the next 5 days along with triamcinolone  cream 0.1%.  Supportive care recommendations were provided discussed with the patient to include over-the-counter antihistamines, avoiding hot baths or showers, and use of over-the-counter Aveeno colloidal oatmeal bath to help with itching and drying.  Discussed indications with patient regarding follow-up.  Patient was in agreement with this plan of care and verbalizes understanding.  All questions were answered.  Patient stable for discharge.  Final Clinical Impressions(s) / UC Diagnoses   Final diagnoses:  None   Discharge Instructions   None    ED Prescriptions   None    PDMP not reviewed this encounter.   Gilmer Etta PARAS, NP 08/02/24 1708

## 2024-08-02 NOTE — ED Triage Notes (Addendum)
 Pt reports rash to left cheek and bilateral upper arms since Sunday. Denies any new self care products. Pt reports intense itching sensation, has tried otc lotion and medication with no change in itching. NAD noted. Airway patent.

## 2024-08-02 NOTE — Discharge Instructions (Addendum)
 You were given an injection of Decadron  10mg  today. Start the prednisone  on tomorrow. Take medication as prescribed. May also take over-the-counter Zyrtec  during the daytime and Benadryl at bedtime to help with itching. Avoid hot baths or showers while symptoms persist.  Recommend taking lukewarm baths. May apply cool cloths to the area to help with itching or discomfort. Avoid scratching, rubbing, or manipulating the areas while symptoms persist. Recommend Aveeno colloidal oatmeal bath to use to help with drying and itching. Follow up if symptoms do not improve.

## 2024-08-05 ENCOUNTER — Other Ambulatory Visit: Payer: Self-pay | Admitting: Nurse Practitioner

## 2024-08-05 DIAGNOSIS — E559 Vitamin D deficiency, unspecified: Secondary | ICD-10-CM

## 2024-08-05 DIAGNOSIS — K219 Gastro-esophageal reflux disease without esophagitis: Secondary | ICD-10-CM

## 2024-08-05 DIAGNOSIS — R7303 Prediabetes: Secondary | ICD-10-CM

## 2024-08-18 ENCOUNTER — Telehealth: Payer: Self-pay

## 2024-08-18 DIAGNOSIS — K219 Gastro-esophageal reflux disease without esophagitis: Secondary | ICD-10-CM

## 2024-08-18 DIAGNOSIS — R7303 Prediabetes: Secondary | ICD-10-CM

## 2024-08-18 DIAGNOSIS — E559 Vitamin D deficiency, unspecified: Secondary | ICD-10-CM

## 2024-08-18 NOTE — Telephone Encounter (Signed)
 I do need see this in her current med list. Looks like is was d/c on 03/08/24 appt.   Copied from CRM [id number]. Topic: Clinical - Medication Question >> Aug 18, 2024 12:51 PM Leonette P wrote: Reason for CRM: pt called saying she called the pharmacy about getting a refill on the Omeprazole  but was told it was denied.  It says too early but when I looked it appeared to be removed from her list.  She said she is still taking it. [phone]

## 2024-08-19 ENCOUNTER — Other Ambulatory Visit: Payer: Self-pay

## 2024-08-19 DIAGNOSIS — Z13820 Encounter for screening for osteoporosis: Secondary | ICD-10-CM

## 2024-08-19 MED ORDER — OMEPRAZOLE 40 MG PO CPDR: 40.0000 mg | DELAYED_RELEASE_CAPSULE | Freq: Every day | ORAL | 3 refills | Status: AC

## 2024-08-19 NOTE — Addendum Note (Signed)
 Addended by: Xana Bradt, CAMIE E on: 08/19/2024 08:29 AM   Modules accepted: Orders

## 2024-08-19 NOTE — Telephone Encounter (Signed)
 I sent refills on this. I would like her to have a repeat bone density if this has not been done in the last two years since she has been on this medication for quite some time. It can reduce the absorption of minerals needed to keep bones healthy.

## 2024-08-22 ENCOUNTER — Other Ambulatory Visit: Payer: Self-pay

## 2024-10-07 ENCOUNTER — Other Ambulatory Visit: Payer: Self-pay | Admitting: Nurse Practitioner

## 2024-10-07 DIAGNOSIS — F411 Generalized anxiety disorder: Secondary | ICD-10-CM

## 2024-10-10 NOTE — Telephone Encounter (Signed)
 Last appt 04/14/24

## 2025-03-07 ENCOUNTER — Ambulatory Visit: Payer: Self-pay
